# Patient Record
Sex: Female | Born: 1985 | Race: Black or African American | Hispanic: No | Marital: Single | Smoking: Current every day smoker
Health system: Southern US, Community
[De-identification: ages and names within clinical notes are randomized; demographics above are authoritative.]

## PROBLEM LIST (undated history)

## (undated) DIAGNOSIS — R51 Headache: Secondary | ICD-10-CM

## (undated) DIAGNOSIS — I10 Essential (primary) hypertension: Secondary | ICD-10-CM

## (undated) DIAGNOSIS — S62609A Fracture of unspecified phalanx of unspecified finger, initial encounter for closed fracture: Secondary | ICD-10-CM

## (undated) DIAGNOSIS — M419 Scoliosis, unspecified: Secondary | ICD-10-CM

## (undated) DIAGNOSIS — J302 Other seasonal allergic rhinitis: Secondary | ICD-10-CM

## (undated) HISTORY — DX: Scoliosis, unspecified: M41.9

---
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
## 2004-04-15 ENCOUNTER — Emergency Department (HOSPITAL_COMMUNITY): Admission: EM | Admit: 2004-04-15 | Discharge: 2004-04-15 | Payer: Self-pay | Admitting: Family Medicine

## 2004-04-26 ENCOUNTER — Inpatient Hospital Stay (HOSPITAL_COMMUNITY): Admission: AD | Admit: 2004-04-26 | Discharge: 2004-04-26 | Payer: Self-pay | Admitting: Obstetrics and Gynecology

## 2004-04-28 ENCOUNTER — Inpatient Hospital Stay (HOSPITAL_COMMUNITY): Admission: AD | Admit: 2004-04-28 | Discharge: 2004-04-28 | Payer: Self-pay | Admitting: Obstetrics and Gynecology

## 2004-05-13 ENCOUNTER — Ambulatory Visit: Payer: Self-pay | Admitting: Obstetrics and Gynecology

## 2004-11-10 ENCOUNTER — Inpatient Hospital Stay (HOSPITAL_COMMUNITY): Admission: AD | Admit: 2004-11-10 | Discharge: 2004-11-10 | Payer: Self-pay | Admitting: Obstetrics and Gynecology

## 2005-02-09 ENCOUNTER — Inpatient Hospital Stay (HOSPITAL_COMMUNITY): Admission: AD | Admit: 2005-02-09 | Discharge: 2005-02-09 | Payer: Self-pay | Admitting: Obstetrics & Gynecology

## 2005-03-30 ENCOUNTER — Ambulatory Visit (HOSPITAL_COMMUNITY): Admission: RE | Admit: 2005-03-30 | Discharge: 2005-03-30 | Payer: Self-pay | Admitting: *Deleted

## 2005-04-13 ENCOUNTER — Ambulatory Visit: Payer: Self-pay | Admitting: *Deleted

## 2005-05-04 ENCOUNTER — Ambulatory Visit: Payer: Self-pay | Admitting: Obstetrics & Gynecology

## 2005-05-11 ENCOUNTER — Ambulatory Visit: Payer: Self-pay | Admitting: *Deleted

## 2005-05-18 ENCOUNTER — Ambulatory Visit: Payer: Self-pay | Admitting: Obstetrics & Gynecology

## 2005-05-25 ENCOUNTER — Ambulatory Visit: Payer: Self-pay | Admitting: Obstetrics & Gynecology

## 2005-05-31 ENCOUNTER — Ambulatory Visit: Payer: Self-pay | Admitting: Family Medicine

## 2005-05-31 ENCOUNTER — Inpatient Hospital Stay (HOSPITAL_COMMUNITY): Admission: RE | Admit: 2005-05-31 | Discharge: 2005-06-02 | Payer: Self-pay | Admitting: *Deleted

## 2005-06-05 ENCOUNTER — Inpatient Hospital Stay (HOSPITAL_COMMUNITY): Admission: AD | Admit: 2005-06-05 | Discharge: 2005-06-05 | Payer: Self-pay | Admitting: *Deleted

## 2005-06-09 ENCOUNTER — Ambulatory Visit: Payer: Self-pay | Admitting: Family Medicine

## 2005-06-16 ENCOUNTER — Ambulatory Visit: Payer: Self-pay | Admitting: Family Medicine

## 2005-06-23 ENCOUNTER — Ambulatory Visit: Payer: Self-pay | Admitting: *Deleted

## 2005-06-30 ENCOUNTER — Ambulatory Visit: Payer: Self-pay | Admitting: Family Medicine

## 2005-07-07 ENCOUNTER — Ambulatory Visit: Payer: Self-pay | Admitting: Family Medicine

## 2005-07-14 ENCOUNTER — Ambulatory Visit: Payer: Self-pay | Admitting: Family Medicine

## 2005-07-15 ENCOUNTER — Inpatient Hospital Stay (HOSPITAL_COMMUNITY): Admission: AD | Admit: 2005-07-15 | Discharge: 2005-07-15 | Payer: Self-pay | Admitting: Obstetrics and Gynecology

## 2005-07-15 ENCOUNTER — Ambulatory Visit: Payer: Self-pay | Admitting: Family Medicine

## 2005-07-17 ENCOUNTER — Ambulatory Visit: Payer: Self-pay | Admitting: *Deleted

## 2005-07-17 ENCOUNTER — Inpatient Hospital Stay (HOSPITAL_COMMUNITY): Admission: AD | Admit: 2005-07-17 | Discharge: 2005-07-19 | Payer: Self-pay | Admitting: Family Medicine

## 2005-09-16 ENCOUNTER — Emergency Department (HOSPITAL_COMMUNITY): Admission: EM | Admit: 2005-09-16 | Discharge: 2005-09-16 | Payer: Self-pay | Admitting: Family Medicine

## 2005-09-21 ENCOUNTER — Emergency Department (HOSPITAL_COMMUNITY): Admission: EM | Admit: 2005-09-21 | Discharge: 2005-09-21 | Payer: Self-pay | Admitting: Emergency Medicine

## 2005-09-28 ENCOUNTER — Encounter: Admission: RE | Admit: 2005-09-28 | Discharge: 2005-09-28 | Payer: Self-pay | Admitting: Nephrology

## 2005-10-11 ENCOUNTER — Emergency Department (HOSPITAL_COMMUNITY): Admission: EM | Admit: 2005-10-11 | Discharge: 2005-10-11 | Payer: Self-pay | Admitting: Family Medicine

## 2005-10-17 ENCOUNTER — Emergency Department (HOSPITAL_COMMUNITY): Admission: EM | Admit: 2005-10-17 | Discharge: 2005-10-17 | Payer: Self-pay | Admitting: Emergency Medicine

## 2005-10-20 ENCOUNTER — Emergency Department (HOSPITAL_COMMUNITY): Admission: EM | Admit: 2005-10-20 | Discharge: 2005-10-20 | Payer: Self-pay | Admitting: Emergency Medicine

## 2005-11-14 ENCOUNTER — Inpatient Hospital Stay (HOSPITAL_COMMUNITY): Admission: AD | Admit: 2005-11-14 | Discharge: 2005-11-14 | Payer: Self-pay | Admitting: *Deleted

## 2006-06-07 ENCOUNTER — Inpatient Hospital Stay (HOSPITAL_COMMUNITY): Admission: AD | Admit: 2006-06-07 | Discharge: 2006-06-07 | Payer: Self-pay | Admitting: Gynecology

## 2006-06-09 ENCOUNTER — Ambulatory Visit: Payer: Self-pay | Admitting: Gynecology

## 2006-06-23 ENCOUNTER — Emergency Department (HOSPITAL_COMMUNITY): Admission: EM | Admit: 2006-06-23 | Discharge: 2006-06-23 | Payer: Self-pay | Admitting: Family Medicine

## 2006-10-05 ENCOUNTER — Encounter: Admission: RE | Admit: 2006-10-05 | Discharge: 2006-10-25 | Payer: Self-pay | Admitting: Sports Medicine

## 2006-12-08 ENCOUNTER — Emergency Department (HOSPITAL_COMMUNITY): Admission: EM | Admit: 2006-12-08 | Discharge: 2006-12-08 | Payer: Self-pay | Admitting: Family Medicine

## 2007-08-16 ENCOUNTER — Inpatient Hospital Stay (HOSPITAL_COMMUNITY): Admission: AD | Admit: 2007-08-16 | Discharge: 2007-08-16 | Payer: Self-pay | Admitting: Obstetrics & Gynecology

## 2007-09-27 ENCOUNTER — Ambulatory Visit (HOSPITAL_COMMUNITY): Admission: RE | Admit: 2007-09-27 | Discharge: 2007-09-27 | Payer: Self-pay | Admitting: Obstetrics & Gynecology

## 2007-10-04 ENCOUNTER — Ambulatory Visit: Payer: Self-pay | Admitting: Obstetrics & Gynecology

## 2007-10-11 ENCOUNTER — Ambulatory Visit (HOSPITAL_COMMUNITY): Admission: RE | Admit: 2007-10-11 | Discharge: 2007-10-11 | Payer: Self-pay | Admitting: Obstetrics & Gynecology

## 2007-11-01 ENCOUNTER — Ambulatory Visit: Payer: Self-pay | Admitting: Family Medicine

## 2007-11-08 ENCOUNTER — Ambulatory Visit: Payer: Self-pay | Admitting: Family Medicine

## 2007-11-15 ENCOUNTER — Ambulatory Visit: Payer: Self-pay | Admitting: Obstetrics & Gynecology

## 2007-11-22 ENCOUNTER — Ambulatory Visit: Payer: Self-pay | Admitting: Family Medicine

## 2007-11-22 ENCOUNTER — Ambulatory Visit (HOSPITAL_COMMUNITY): Admission: RE | Admit: 2007-11-22 | Discharge: 2007-11-22 | Payer: Self-pay | Admitting: Obstetrics & Gynecology

## 2007-11-29 ENCOUNTER — Ambulatory Visit: Payer: Self-pay | Admitting: Family Medicine

## 2007-12-06 ENCOUNTER — Ambulatory Visit: Payer: Self-pay | Admitting: Family Medicine

## 2007-12-13 ENCOUNTER — Ambulatory Visit: Payer: Self-pay | Admitting: Obstetrics & Gynecology

## 2007-12-20 ENCOUNTER — Ambulatory Visit: Payer: Self-pay | Admitting: Obstetrics & Gynecology

## 2007-12-20 ENCOUNTER — Ambulatory Visit (HOSPITAL_COMMUNITY): Admission: RE | Admit: 2007-12-20 | Discharge: 2007-12-20 | Payer: Self-pay | Admitting: Gynecology

## 2007-12-27 ENCOUNTER — Ambulatory Visit: Payer: Self-pay | Admitting: Obstetrics & Gynecology

## 2008-01-03 ENCOUNTER — Ambulatory Visit: Payer: Self-pay | Admitting: Family Medicine

## 2008-01-10 ENCOUNTER — Ambulatory Visit: Payer: Self-pay | Admitting: Family Medicine

## 2008-01-11 ENCOUNTER — Ambulatory Visit: Payer: Self-pay | Admitting: Obstetrics and Gynecology

## 2008-01-24 ENCOUNTER — Ambulatory Visit: Payer: Self-pay | Admitting: Obstetrics & Gynecology

## 2008-01-31 ENCOUNTER — Ambulatory Visit: Payer: Self-pay | Admitting: Family Medicine

## 2008-02-01 ENCOUNTER — Ambulatory Visit: Payer: Self-pay | Admitting: Family Medicine

## 2008-02-01 ENCOUNTER — Ambulatory Visit (HOSPITAL_COMMUNITY): Admission: RE | Admit: 2008-02-01 | Discharge: 2008-02-01 | Payer: Self-pay | Admitting: Family Medicine

## 2008-02-04 ENCOUNTER — Ambulatory Visit: Payer: Self-pay | Admitting: Obstetrics & Gynecology

## 2008-02-07 ENCOUNTER — Ambulatory Visit: Payer: Self-pay | Admitting: Family Medicine

## 2008-02-11 ENCOUNTER — Ambulatory Visit: Payer: Self-pay | Admitting: Family Medicine

## 2008-02-11 ENCOUNTER — Inpatient Hospital Stay (HOSPITAL_COMMUNITY): Admission: AD | Admit: 2008-02-11 | Discharge: 2008-02-14 | Payer: Self-pay | Admitting: Obstetrics & Gynecology

## 2008-02-12 HISTORY — PX: TUBAL LIGATION: SHX77

## 2008-05-20 IMAGING — US US OB FOLLOW-UP
1 series · 14 of 28 positions shown · non-contrast
Comparison: none

OBSTETRICAL ULTRASOUND:
 This ultrasound exam was performed in the [HOSPITAL] Ultrasound Department.  The OB US report was generated in the AS system, and faxed to the ordering physician.  This report is also available in [REDACTED] PACS.

[Series 1: us ob follow-up · 14 of 30 slices shown]
[im 2/30]
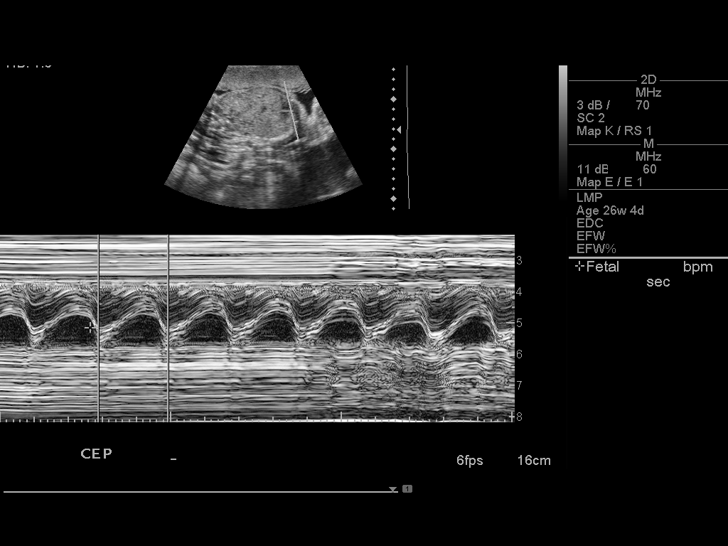
[im 4/30]
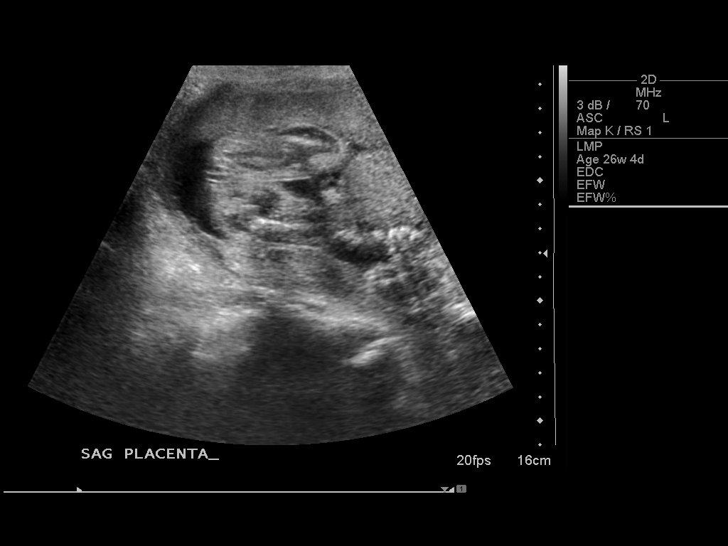
[im 6/30]
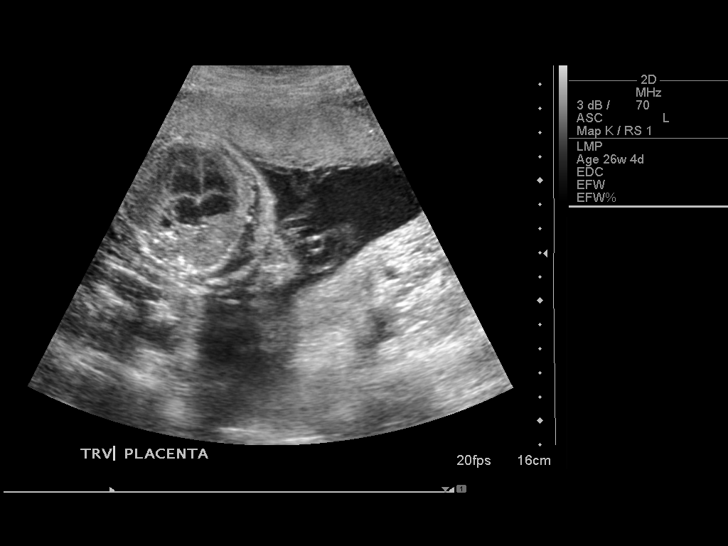
[im 8/30]
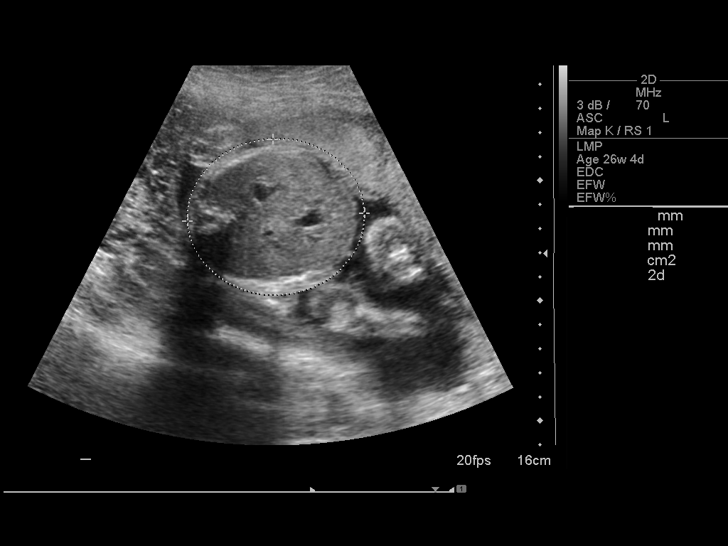
[im 10/30]
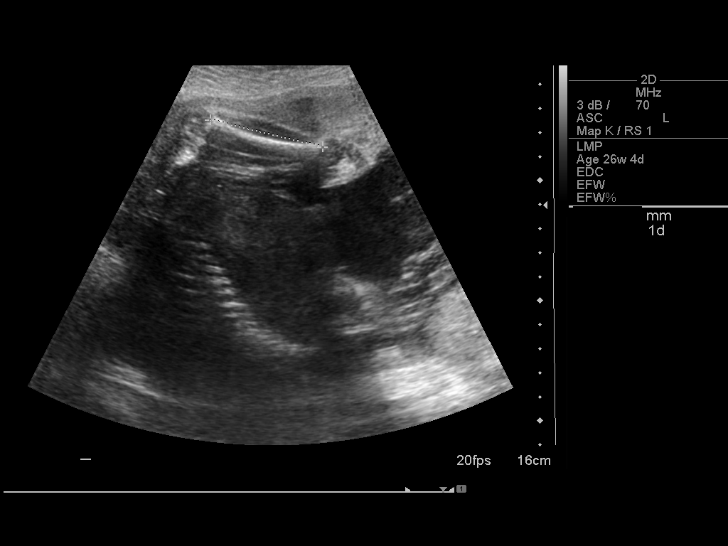
[im 12/30]
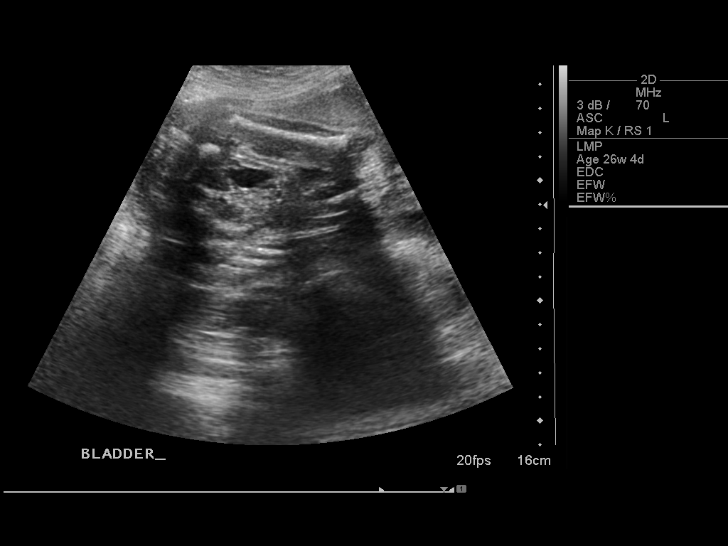
[im 14/30]
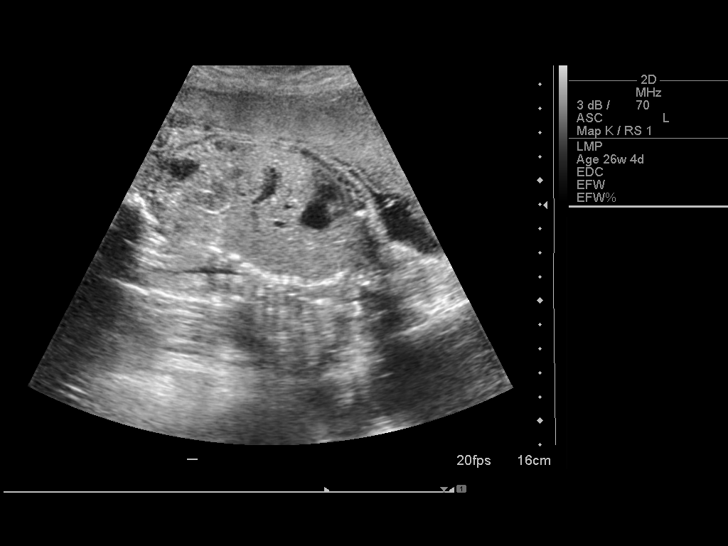
[im 17/30]
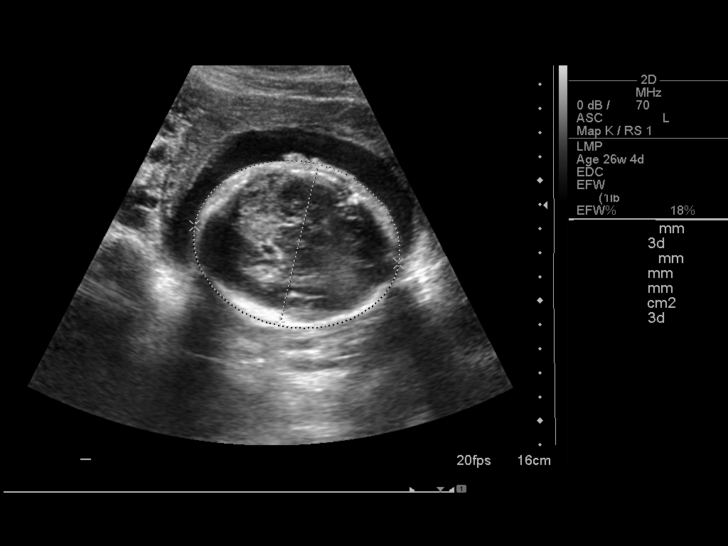
[im 19/30]
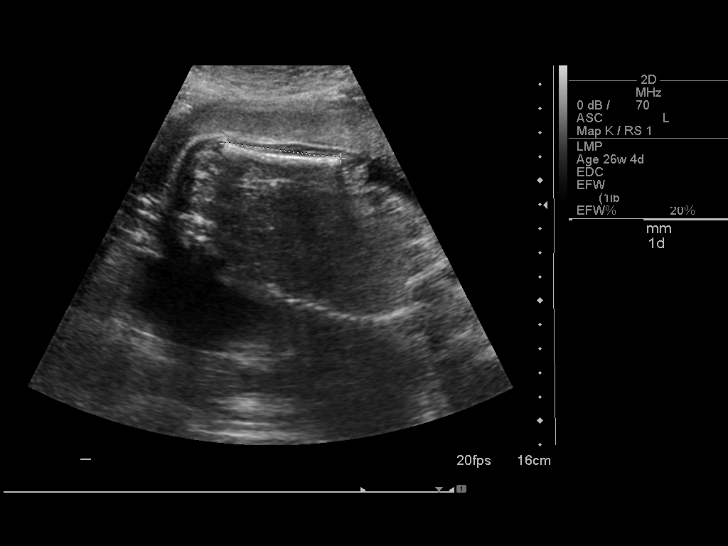
[im 21/30]
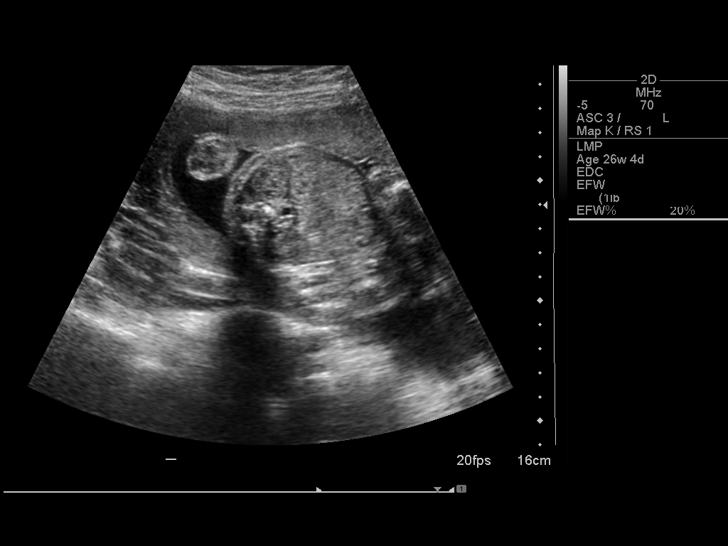
[im 23/30]
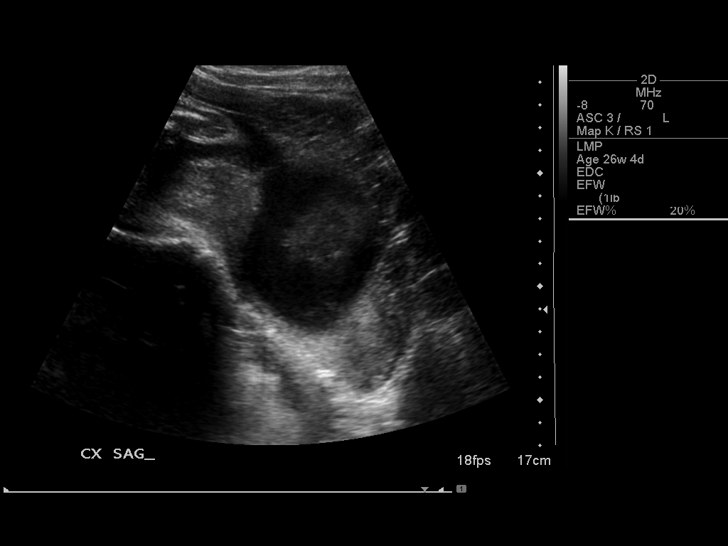
[im 25/30]
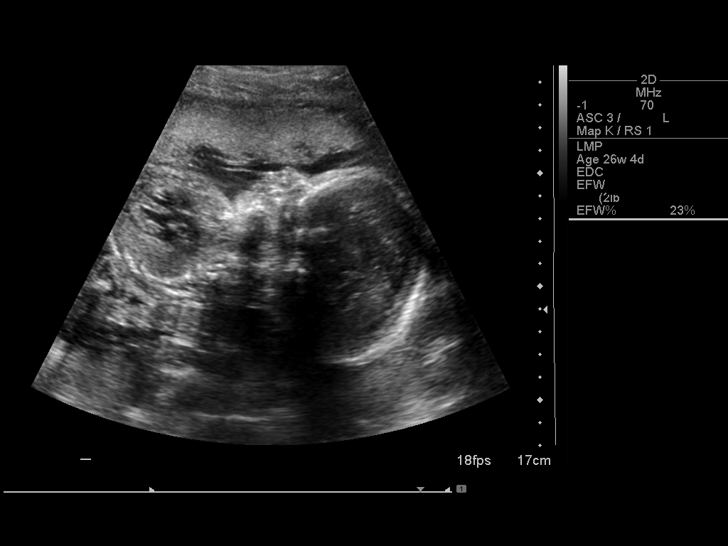
[im 27/30]
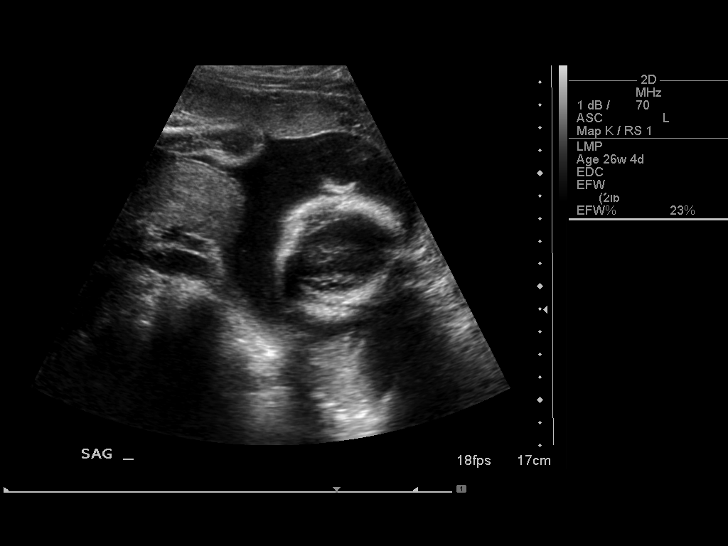
[im 30/30]
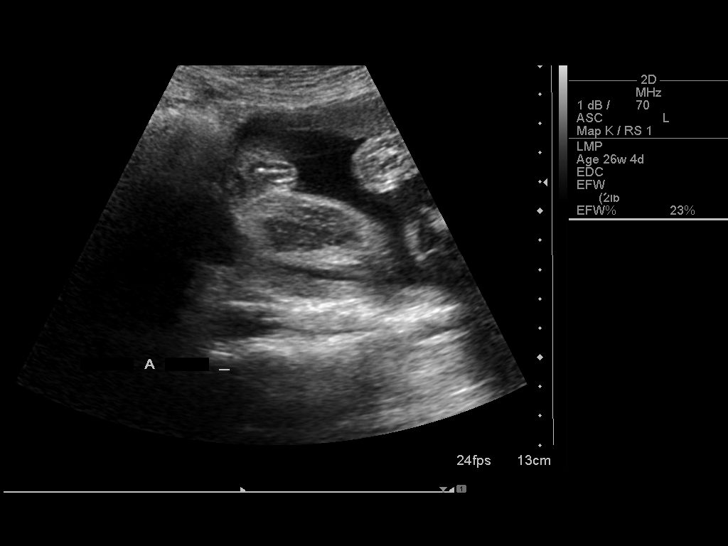

[14 of 28 positions shown; findings below may reference images not displayed]

IMPRESSION: See AS Obstetric US report.

## 2008-12-17 ENCOUNTER — Emergency Department (HOSPITAL_COMMUNITY): Admission: EM | Admit: 2008-12-17 | Discharge: 2008-12-18 | Payer: Self-pay | Admitting: Emergency Medicine

## 2009-03-09 ENCOUNTER — Emergency Department (HOSPITAL_COMMUNITY): Admission: EM | Admit: 2009-03-09 | Discharge: 2009-03-09 | Payer: Self-pay | Admitting: Family Medicine

## 2009-03-09 ENCOUNTER — Emergency Department (HOSPITAL_COMMUNITY): Admission: EM | Admit: 2009-03-09 | Discharge: 2009-03-09 | Payer: Self-pay | Admitting: Emergency Medicine

## 2009-04-18 ENCOUNTER — Emergency Department (HOSPITAL_COMMUNITY): Admission: EM | Admit: 2009-04-18 | Discharge: 2009-04-18 | Payer: Self-pay | Admitting: Family Medicine

## 2009-06-05 ENCOUNTER — Emergency Department (HOSPITAL_COMMUNITY): Admission: EM | Admit: 2009-06-05 | Discharge: 2009-06-05 | Payer: Self-pay | Admitting: Family Medicine

## 2009-06-25 ENCOUNTER — Emergency Department (HOSPITAL_COMMUNITY): Admission: EM | Admit: 2009-06-25 | Discharge: 2009-06-25 | Payer: Self-pay | Admitting: Emergency Medicine

## 2009-07-24 ENCOUNTER — Emergency Department (HOSPITAL_COMMUNITY): Admission: EM | Admit: 2009-07-24 | Discharge: 2009-07-24 | Payer: Self-pay | Admitting: Family Medicine

## 2009-08-01 ENCOUNTER — Inpatient Hospital Stay (HOSPITAL_COMMUNITY): Admission: AD | Admit: 2009-08-01 | Discharge: 2009-08-01 | Payer: Self-pay | Admitting: Obstetrics & Gynecology

## 2009-10-19 ENCOUNTER — Inpatient Hospital Stay (HOSPITAL_COMMUNITY): Admission: AD | Admit: 2009-10-19 | Discharge: 2009-10-19 | Payer: Self-pay | Admitting: Obstetrics & Gynecology

## 2009-11-22 ENCOUNTER — Emergency Department (HOSPITAL_COMMUNITY): Admission: EM | Admit: 2009-11-22 | Discharge: 2009-11-22 | Payer: Self-pay | Admitting: Emergency Medicine

## 2010-01-20 ENCOUNTER — Emergency Department (HOSPITAL_COMMUNITY): Admission: EM | Admit: 2010-01-20 | Discharge: 2010-01-20 | Payer: Self-pay | Admitting: Emergency Medicine

## 2010-03-26 ENCOUNTER — Emergency Department (HOSPITAL_COMMUNITY): Admission: EM | Admit: 2010-03-26 | Discharge: 2010-03-26 | Payer: Self-pay | Admitting: Emergency Medicine

## 2010-04-19 ENCOUNTER — Emergency Department (HOSPITAL_COMMUNITY): Admission: EM | Admit: 2010-04-19 | Discharge: 2010-04-19 | Payer: Self-pay | Admitting: Emergency Medicine

## 2010-06-09 ENCOUNTER — Encounter (INDEPENDENT_AMBULATORY_CARE_PROVIDER_SITE_OTHER): Payer: Self-pay | Admitting: *Deleted

## 2010-06-09 ENCOUNTER — Ambulatory Visit: Payer: Self-pay | Admitting: Internal Medicine

## 2010-06-09 LAB — CONVERTED CEMR LAB
BUN: 11 mg/dL (ref 6–23)
Basophils Relative: 1 % (ref 0–1)
Chloride: 102 meq/L (ref 96–112)
Creatinine, Ser: 0.7 mg/dL (ref 0.40–1.20)
Glucose, Bld: 79 mg/dL (ref 70–99)
Hemoglobin: 12.9 g/dL (ref 12.0–15.0)
Lymphs Abs: 2.8 10*3/uL (ref 0.7–4.0)
MCHC: 33.9 g/dL (ref 30.0–36.0)
MCV: 87.4 fL (ref 78.0–100.0)
Microalb, Ur: 0.52 mg/dL (ref 0.00–1.89)
Monocytes Absolute: 0.5 10*3/uL (ref 0.1–1.0)
Monocytes Relative: 9 % (ref 3–12)
Neutro Abs: 2 10*3/uL (ref 1.7–7.7)
RBC: 4.36 M/uL (ref 3.87–5.11)
WBC: 5.5 10*3/uL (ref 4.0–10.5)

## 2010-10-27 ENCOUNTER — Emergency Department (HOSPITAL_COMMUNITY)
Admission: EM | Admit: 2010-10-27 | Discharge: 2010-10-27 | Disposition: A | Discharge status: Home / Self Care | Payer: Self-pay | Attending: Emergency Medicine | Admitting: Emergency Medicine

## 2010-10-27 DIAGNOSIS — R5383 Other fatigue: Secondary | ICD-10-CM | POA: Insufficient documentation

## 2010-10-27 DIAGNOSIS — R110 Nausea: Secondary | ICD-10-CM | POA: Insufficient documentation

## 2010-10-27 DIAGNOSIS — Z79899 Other long term (current) drug therapy: Secondary | ICD-10-CM | POA: Insufficient documentation

## 2010-10-27 DIAGNOSIS — I10 Essential (primary) hypertension: Secondary | ICD-10-CM | POA: Insufficient documentation

## 2010-10-27 DIAGNOSIS — F3289 Other specified depressive episodes: Secondary | ICD-10-CM | POA: Insufficient documentation

## 2010-10-27 DIAGNOSIS — K2970 Gastritis, unspecified, without bleeding: Secondary | ICD-10-CM | POA: Insufficient documentation

## 2010-10-27 DIAGNOSIS — J45909 Unspecified asthma, uncomplicated: Secondary | ICD-10-CM | POA: Insufficient documentation

## 2010-10-27 DIAGNOSIS — R10819 Abdominal tenderness, unspecified site: Secondary | ICD-10-CM | POA: Insufficient documentation

## 2010-10-27 DIAGNOSIS — R5381 Other malaise: Secondary | ICD-10-CM | POA: Insufficient documentation

## 2010-10-27 DIAGNOSIS — R109 Unspecified abdominal pain: Secondary | ICD-10-CM | POA: Insufficient documentation

## 2010-10-27 DIAGNOSIS — F329 Major depressive disorder, single episode, unspecified: Secondary | ICD-10-CM | POA: Insufficient documentation

## 2010-10-27 LAB — URINALYSIS, ROUTINE W REFLEX MICROSCOPIC
Hgb urine dipstick: NEGATIVE
Ketones, ur: NEGATIVE mg/dL
Protein, ur: NEGATIVE mg/dL
Urine Glucose, Fasting: NEGATIVE mg/dL
Urobilinogen, UA: 0.2 mg/dL (ref 0.0–1.0)

## 2010-10-27 LAB — BASIC METABOLIC PANEL
BUN: 8 mg/dL (ref 6–23)
Calcium: 8.2 mg/dL — ABNORMAL LOW (ref 8.4–10.5)
Creatinine, Ser: 0.71 mg/dL (ref 0.4–1.2)
GFR calc non Af Amer: >60 mL/min (ref >60)
Glucose, Bld: 101 mg/dL — ABNORMAL HIGH (ref 70–99)

## 2010-10-27 LAB — CBC
HCT: 32.4 % — ABNORMAL LOW (ref 36.0–46.0)
MCH: 30 pg (ref 26.0–34.0)
MCHC: 34.9 g/dL (ref 30.0–36.0)
MCV: 85.9 fL (ref 78.0–100.0)
Platelets: 189 10*3/uL (ref 150–400)
RDW: 13.3 % (ref 11.5–15.5)
WBC: 5.5 10*3/uL (ref 4.0–10.5)

## 2010-10-27 LAB — DIFFERENTIAL
Eosinophils Absolute: 0.2 10*3/uL (ref 0.0–0.7)
Eosinophils Relative: 4 % (ref 0–5)
Lymphs Abs: 3.7 10*3/uL (ref 0.7–4.0)
Monocytes Absolute: 0.4 10*3/uL (ref 0.1–1.0)
Monocytes Relative: 6 % (ref 3–12)

## 2010-10-27 LAB — LIPASE, BLOOD: Lipase: 37 U/L (ref 11–59)

## 2010-10-27 LAB — HEPATIC FUNCTION PANEL
Bilirubin, Direct: <0.1 mg/dL (ref 0.0–0.3)
Total Protein: 6.1 g/dL (ref 6.0–8.3)

## 2010-11-15 LAB — POCT I-STAT, CHEM 8
BUN: 13 mg/dL (ref 6–23)
Calcium, Ion: 1.13 mmol/L (ref 1.12–1.32)
Creatinine, Ser: 0.7 mg/dL (ref 0.4–1.2)
Hemoglobin: 14.3 g/dL (ref 12.0–15.0)
Sodium: 138 mEq/L (ref 135–145)
TCO2: 26 mmol/L (ref 0–100)

## 2010-11-17 LAB — WET PREP, GENITAL
Trich, Wet Prep: NONE SEEN
Yeast Wet Prep HPF POC: NONE SEEN

## 2010-11-17 LAB — DIFFERENTIAL
Eosinophils Absolute: 0.3 10*3/uL (ref 0.0–0.7)
Lymphocytes Relative: 52 % — ABNORMAL HIGH (ref 12–46)
Lymphs Abs: 3.2 10*3/uL (ref 0.7–4.0)
Monocytes Relative: 8 % (ref 3–12)
Neutro Abs: 2.1 10*3/uL (ref 1.7–7.7)
Neutrophils Relative %: 34 % — ABNORMAL LOW (ref 43–77)

## 2010-11-17 LAB — URINALYSIS, ROUTINE W REFLEX MICROSCOPIC
Glucose, UA: NEGATIVE mg/dL
Hgb urine dipstick: NEGATIVE
Ketones, ur: NEGATIVE mg/dL
Protein, ur: NEGATIVE mg/dL
Urobilinogen, UA: 1 mg/dL (ref 0.0–1.0)

## 2010-11-17 LAB — CBC
Platelets: 185 10*3/uL (ref 150–400)
RBC: 3.81 MIL/uL — ABNORMAL LOW (ref 3.87–5.11)
WBC: 6.1 10*3/uL (ref 4.0–10.5)

## 2010-11-30 LAB — WET PREP, GENITAL: Yeast Wet Prep HPF POC: NONE SEEN

## 2010-11-30 LAB — URINALYSIS, ROUTINE W REFLEX MICROSCOPIC
Glucose, UA: NEGATIVE mg/dL
Protein, ur: NEGATIVE mg/dL
Specific Gravity, Urine: 1.02 (ref 1.005–1.030)
pH: 7 (ref 5.0–8.0)

## 2010-11-30 LAB — GC/CHLAMYDIA PROBE AMP, GENITAL: GC Probe Amp, Genital: NEGATIVE

## 2010-11-30 LAB — POCT PREGNANCY, URINE: Preg Test, Ur: NEGATIVE

## 2010-12-02 LAB — D-DIMER, QUANTITATIVE: D-Dimer, Quant: <0.22 ug/mL-FEU (ref 0.00–0.48)

## 2011-01-11 NOTE — Op Note (Signed)
Cassandra Nicholson, LOS           ACCOUNT NO.:  [id number]   MEDICAL RECORD NO.:  [id number]          PATIENT TYPE:  INP   LOCATION:                                [facility]ILITY:  [facility]   PHYSICIAN:  Tanya S. Shawnie Pons, M.D.   DATE OF BIRTH:  March 24, 1986   DATE OF PROCEDURE:  02/12/2008  DATE OF DISCHARGE:  02/14/2008                               OPERATIVE REPORT   PREOPERATIVE DIAGNOSES:  1. Postpartum day #0 from spontaneous vaginal delivery.  2. Desires sterilization.   POSTOPERATIVE DIAGNOSES:  1. Postpartum day #0 from spontaneous vaginal delivery.  2. Desires sterilization.   PROCEDURE:  Bilateral tubal occlusion with Filshie clips.   SURGEON:  Shelbie Proctor. Shawnie Pons, MD   ASSISTANT:  Karlton Lemon, MD   ANESTHESIA:  Epidural.   FINDINGS:  Normal tubes, uterus at level of umbilicus.   SPECIMENS:  None.   ESTIMATED BLOOD LOSS:  Less than 10 mL.   COMPLICATIONS:  None immediate.   INDICATIONS FOR PROCEDURE:  Cassandra Nicholson is a 25 year old gravida 3,  para 2-1-0-3 that is postpartum day #0 from spontaneous vaginal delivery  of viable infant female.  She desires permanent sterilization with  bilateral tubal occlusion.  The patient has been counseled about the  permanent nature of the procedure and the risk of possible failure.  She  has also been counseled about the risks of bleeding, infection, and  injury to surrounding tissues.  She thus been counseled about the risk  of ectopic, if she does get pregnant after this procedure.  The patient  has signed her Medicaid consent 30 days in advance and understands the  permanent nature and risks of the procedure.   DESCRIPTION OF PROCEDURE:  The patient was taken to the operating room  where epidural was re-dosed that she had placed during labor.  She was  placed in the supine position and prepped and draped in the usual  sterile manner.  After ensuring adequate anesthesia, a transverse skin  incision was made 1 cm below the umbilicus.   Incision was carried down  to the subcutaneous tissues with the scalpel.  The fascia was identified  and entered bluntly with hemostats.  It was further opened with Mayo  scissors.  The peritoneum was identified and entered bluntly with  hemostat.  Army[employer] retractors were placed, and the patient was placed  in Trendelenburg with a rightward tilt.  A Ray-Tec was tagged with a  hemostat was then placed within the abdomen to displace omentum and  provide good visualization.  The left tube was visualized and clamped  with Babcock clamp.  A second Tanja Port was used to follow the tube out to  the fimbria.  The fimbria was easily identified and two Babcock clamps  were then placed at the isthmus ampullary junction.  Under direct  visualization, a Filshie clip was placed at the isthmus ampullary  junction with good occlusion of the tube.  After placement of the  Filshie clip, the Babcock clamps released and the tube was allowed to  fall back within the abdominal cavity.  The patient was then turned to  the left and attention was turned to identify the right tube.  The right  tube was identified and clamped with Babcock clamp.  In the same manner,  the second Babcock clamp was used to follow the tube out to the fimbria  which was easily identified.  The Babcock clamps were then placed at the  isthmus ampullary junction, and a Filshie clip was placed between the  two Babcock clamps under direct visualization with good occlusion of the  tube.  After placement of the clip, the Babcock clamps were released.  The tube was allowed to fall back within the peritoneal cavity.  The Ray-  Tec sponge was removed from the abdomen and the abdomen was closed  beginning with the fascial layer with 0 Vicryl in a running unlocked  fashion.  The subcutaneous tissue was hemostatic.  The skin was closed  with a Vicryl skin subcuticular stitch in the usual fashion.  The  patient tolerated the procedure well and the  sponge and needle counts  were correct x3.  The patient was in Post Anesthesia Care Unit in stable  condition.      Karlton Lemon, MD  Electronically Signed     ______________________________  Shelbie Proctor. Shawnie Pons, M.D.    NS/MEDQ  D:  02/12/2008  T:  02/13/2008  Job:  [id number]

## 2011-01-14 NOTE — Group Therapy Note (Signed)
NAME:  Cassandra Nicholson, Cassandra Nicholson                     ACCOUNT NO.:  [id number]   MEDICAL RECORD NO.:  [id number]                   PATIENT TYPE:  WOC   LOCATION:  [facility]                           FACILITY:  [facility]   PHYSICIAN:  Argentina Donovan, MD                     DATE OF BIRTH:  September 09, 1985   DATE OF SERVICE:  05/13/2004                                    CLINIC NOTE   CHIEF COMPLAINT:  Follow-up of PID.   HISTORY OF PRESENT ILLNESS:  This is a 25 year old single G1 P1-0-0-1 who  was seen at the [facility] for a 3-day history of left lower quadrant pain and  yellowish vaginal discharge and pruritus.  No history of fever, nausea,  vomiting, nor urinary symptoms, hematochezia, or melena.  Lab data at the  time showed wet prep with moderate Trichomonas.  GC and chlamydia cultures  were both positive.  An abdominal ultrasound showed normal findings.  Pelvic  ultrasound showed resolving hemorrhagic ovarian cyst with no free fluid.  The patient was diagnosed with PID and given Rocephin 250 mg IM and 1 g of  Zithromax p.o.  She was sent home with a prescription for doxycycline 100 mg  p.o. b.i.d. for 10 days and metronidazole 500 mg p.o. b.i.d. for 14 days  with good compliance.  The patient reports improvement of her symptoms with  resolution of the abdominal pain and vaginal discharge.   PAST MEDICAL HISTORY:  Unremarkable.   SURGICAL HISTORY:  Unremarkable.   MEDICATIONS:  None.   ALLERGIES:  No known drug allergies.   MENSTRUAL HISTORY:  Menarche at 25 years old with irregular cycles lasting 4-  5 days in duration, moderate flow.  Last normal menstrual period was in  August.  Pap smear done in March 2005 was normal.   OBSTETRICAL HISTORY:  G1 P1-0-0-1.  The patient had a spontaneous vaginal  delivery with no complications.   PERSONAL AND SOCIAL HISTORY:  The patient smokes one-half pack per day for 3  years.  Denies alcohol use and is presently not involved in a sexual  relationship.  Had  history of marijuana use for a year.   OBJECTIVE:  VITAL SIGNS:  Blood pressure 124/80, pulse rate of 101.  GENERAL:  Pleasant African-American female, not in distress.  ABDOMEN:  Normal bowel sounds, flat, soft, no masses, no tenderness.  PELVIC:  Normal external genitalia.  Smooth vagina.  No vaginal discharge.  Cervix firm, smooth, nontender on wriggling.  Corpus small and anteverted.  No adnexal masses or tenderness.   ASSESSMENT:  Pelvic inflammatory disease, resolved.   PLAN:  The patient educated on PID and measures to prevent another episode  of PID.  Advised to follow up at [facility] for annual Pap smear and  safe sex using condoms.  Argentina Donovan, MD    PR/MEDQ  D:  05/13/2004  T:  05/13/2004  Job:  [id number]

## 2011-01-14 NOTE — Discharge Summary (Signed)
Cassandra Nicholson, Cassandra Nicholson                     ACCOUNT NO.:  [id number]   MEDICAL RECORD NO.:  [id number]                   PATIENT TYPE:  INP   LOCATION:  9103                                 [facility]ILITY:  [facility]   PHYSICIAN:  Lorne Skeens, D.O.                   DATE OF BIRTH:  11-13-1985   DATE OF ADMISSION:  07/23/2002  DATE OF DISCHARGE:  07/26/2002                                 DISCHARGE SUMMARY   DISCHARGE DIAGNOSES:  1. Dynamic shortening of the cervix by 1.9 cm.  2. Bacterial vaginosis.  3. Group B strep positive.  4. Uterine irritability.   DISCHARGE MEDICATIONS:  1. Flagyl 500 mg one by mouth twice a day times ten days total.  2. Amoxicillin 500 mg one by mouth every eight hours times two weeks with     meals.  3. Prenatal vitamins one by mouth each day.   DISCHARGE INSTRUCTIONS:  Preterm labor precautions and close follow-up with  the high risk clinic.   FOLLOW UP:  August 06, 2002 at 3:15 p.m.   HISTORY OF PRESENT ILLNESS:  Cassandra Nicholson is a 25 year old prima Gravida at 23  and 6/7th weeks by initial ultrasound on 11 weeks that presented four  admission due to a short cervical length seen on ultrasound the day prior to  admission. A transvaginal ultrasound on July 22, 2002 revealed a  cervical length of 1.9 cm and a dynamic cervical change. At this point, she  was also group B strep positive and wet prep was positive for bacterial  vaginosis. On July 22, 2002 her examination reveals a cervix of 1 cm at  50% effaced and high. The patient was admitted to [facility] for IV  Unasyn for 72 hours, put on modified bedrest, and started on by mouth  Metronidazole. Her other risk factors for preterm delivery include a history  of Chlamydia in March of 2003 pre-pregnancy and also treated again in  September of 2003 in the first trimester. She is also a smoker and had a PAP  smear positive for ascus.   PHYSICAL EXAMINATION:  VITAL SIGNS: On admission, vitals were  stable. She  was afebrile. Heart regular rate and rhythm. No murmur, rub, or gallop.  GENERAL: Alert and oriented times three.  LUNGS: Clear to auscultation and percussion bilaterally.  ABDOMEN: Soft, positive bowel sounds. Fundal height at the umbilicus.  Speculum examination was done in the clinic on July 22, 2002 and not  repeated. Digital cervical examination as stated above. Fetal heart rate  baseline was 145.   HOSPITAL COURSE:  During course of stay, the patient was placed on IV fluid  rehydration, modified bedrest and received intermittent monitoring for  Doppler fetal heart tones. The patient was not complaining of contractions,  but all during her stay denied any abdominal pain or cramping. Denies any  vaginal discharge or bleeding. On discharge date, the patient has received  close  to 72 hours of IV Unasyn for her group B strep positive labs. She will  also need intrapartum treatment. The patient will be continued on her  Metronidazole for a total of two weeks at discharge. She will also be placed  on modified bedrest and states that she has help at home. Follow-up  ultrasound is recommended within two weeks to recheck cervical length.   DISPOSITION:  The patient is discharged to home on preterm labor precautions  and she understands the importance of taking her antibiotics at home.                                               Lorne Skeens, D.O.    KL/MEDQ  D:  07/26/2002  T:  07/27/2002  Job:  [id number]   cc:   Conni Elliot, M.D.  [address]  [place]  [place] [address]  Fax: [phone]

## 2011-01-14 NOTE — Discharge Summary (Signed)
Cassandra Nicholson, Cassandra Nicholson           ACCOUNT NO.:  [id number]   MEDICAL RECORD NO.:  [id number]          PATIENT TYPE:  INP   LOCATION:                                [facility]   PHYSICIAN:  Lesly Dukes, M.D. DATE OF BIRTH:  10-31-1985   DATE OF ADMISSION:  DATE OF DISCHARGE:  06/02/2005                                 DISCHARGE SUMMARY   ADMITTING DIAGNOSES:  1.  Preterm cervical change.  2.  Dynamic cervix on ultrasound.  3.  GBS positivity.   DISCHARGE DIAGNOSES:  1.  Preterm cervical change.  2.  Dynamic cervix on ultrasound.  3.  GBS positivity.   DISCHARGE ATTENDING:  Dr. Penne Lash   ADMITTING HISTORY AND PHYSICAL:  Patient is a 25 year old G2, P0-1-0-1 who  presented at 32-4/7 weeks.  She had a routine ultrasound today and was found  to have a dynamic cervix on ultrasound so she was brought in for IV  antibiotics.  On examination patient had an external os of 1 cm that was  able to push my finger towards the internal os, but did not force my finger  in and she was 50% effaced and high.  Patient had GC, Chlamydia, wet prep  cultures done.  She was found to have GBS positivity last week in clinic and  was given a prescription for amoxicillin which the patient did not take.   HOSPITAL COURSE:  Patient was treated with Unasyn.  Patient remained stable,  complained of no contraction.  Felt the baby move and the baby had a  reassuring NST throughout her admission.  Patient at the time of discharge  had external os dilated 1 cm, closed internal os, very high, 50% effaced.   DISCHARGE INSTRUCTIONS:  Patient was discharged to home.  She was to follow  up in the clinic on June 09, 2005 at 9 a.m.  She was to continue the  amoxicillin prescription that she already had to take 500 mg one p.o. t.i.d.  x5 days which will be approximately seven days' medication.  Patient was to  be on bed rest with nothing vaginally while at home.     ______________________________  Karoline Caldwell  B. Merlene Morse, MD    ______________________________  Lesly Dukes, M.D.    ABC/MEDQ  D:  06/02/2005  T:  06/02/2005  Job:  [id number]

## 2011-02-08 ENCOUNTER — Inpatient Hospital Stay (HOSPITAL_COMMUNITY)
Admission: AD | Admit: 2011-02-08 | Discharge: 2011-02-08 | Disposition: A | Discharge status: Home / Self Care | Payer: Self-pay | Source: Ambulatory Visit | Attending: Obstetrics & Gynecology | Admitting: Obstetrics & Gynecology

## 2011-02-08 DIAGNOSIS — R109 Unspecified abdominal pain: Secondary | ICD-10-CM | POA: Insufficient documentation

## 2011-02-08 DIAGNOSIS — K5900 Constipation, unspecified: Secondary | ICD-10-CM | POA: Insufficient documentation

## 2011-02-08 LAB — URINALYSIS, ROUTINE W REFLEX MICROSCOPIC
Hgb urine dipstick: NEGATIVE
Nitrite: NEGATIVE
Specific Gravity, Urine: >1.03 — ABNORMAL HIGH (ref 1.005–1.030)
Urobilinogen, UA: 0.2 mg/dL (ref 0.0–1.0)
pH: 6 (ref 5.0–8.0)

## 2011-02-08 LAB — WET PREP, GENITAL
Trich, Wet Prep: NONE SEEN
Yeast Wet Prep HPF POC: NONE SEEN

## 2011-02-08 LAB — POCT PREGNANCY, URINE: Preg Test, Ur: NEGATIVE

## 2011-05-20 LAB — POCT URINALYSIS DIP (DEVICE)
Bilirubin Urine: NEGATIVE
Glucose, UA: NEGATIVE
Ketones, ur: NEGATIVE
Operator id: 159681
Protein, ur: NEGATIVE
Specific Gravity, Urine: 1.01

## 2011-05-23 LAB — POCT URINALYSIS DIP (DEVICE)
Bilirubin Urine: NEGATIVE
Bilirubin Urine: NEGATIVE
Glucose, UA: NEGATIVE
Ketones, ur: NEGATIVE
Ketones, ur: NEGATIVE
Leukocytes, UA: NEGATIVE
Operator id: 148111
Operator id: 148111
Specific Gravity, Urine: 1.015

## 2011-05-24 LAB — POCT URINALYSIS DIP (DEVICE)
Bilirubin Urine: NEGATIVE
Glucose, UA: NEGATIVE
Glucose, UA: NEGATIVE
Glucose, UA: NEGATIVE
Hgb urine dipstick: NEGATIVE
Ketones, ur: NEGATIVE
Ketones, ur: NEGATIVE
Ketones, ur: NEGATIVE
Nitrite: NEGATIVE
Operator id: 148111
Operator id: 135281
Operator id: 148111
Protein, ur: NEGATIVE
Specific Gravity, Urine: 1.015
Specific Gravity, Urine: 1.02
Specific Gravity, Urine: 1.02
Urobilinogen, UA: 0.2
Urobilinogen, UA: 1
pH: 7

## 2011-05-25 LAB — POCT URINALYSIS DIP (DEVICE)
Bilirubin Urine: NEGATIVE
Bilirubin Urine: NEGATIVE
Hgb urine dipstick: NEGATIVE
Hgb urine dipstick: NEGATIVE
Ketones, ur: NEGATIVE
Ketones, ur: NEGATIVE
Specific Gravity, Urine: 1.015
pH: 7
pH: 7

## 2011-05-26 LAB — POCT URINALYSIS DIP (DEVICE)
Bilirubin Urine: NEGATIVE
Hgb urine dipstick: NEGATIVE
Ketones, ur: NEGATIVE
Ketones, ur: NEGATIVE
pH: 7
pH: 7

## 2011-05-26 LAB — COMPREHENSIVE METABOLIC PANEL
Alkaline Phosphatase: 108
BUN: 7
CO2: 22
Chloride: 105
GFR calc non Af Amer: >60
Glucose, Bld: 92
Potassium: 3.8
Total Bilirubin: 0.7
Total Protein: 6.3

## 2011-05-26 LAB — RPR: RPR Ser Ql: NONREACTIVE

## 2011-05-26 LAB — CBC
HCT: 31.9 — ABNORMAL LOW
Hemoglobin: 10.9 — ABNORMAL LOW
RDW: 12.9

## 2011-05-26 LAB — URIC ACID: Uric Acid, Serum: 4.9

## 2011-06-03 LAB — POCT PREGNANCY, URINE
Operator id: 220991
Preg Test, Ur: POSITIVE

## 2011-06-03 LAB — URINALYSIS, ROUTINE W REFLEX MICROSCOPIC
Nitrite: NEGATIVE
Protein, ur: NEGATIVE
Specific Gravity, Urine: 1.01
Urobilinogen, UA: 0.2

## 2012-01-09 ENCOUNTER — Emergency Department (HOSPITAL_COMMUNITY)
Admission: EM | Admit: 2012-01-09 | Discharge: 2012-01-09 | Disposition: A | Discharge status: Home / Self Care | Payer: Self-pay | Source: Home / Self Care | Attending: Emergency Medicine | Admitting: Emergency Medicine

## 2012-01-09 ENCOUNTER — Telehealth (HOSPITAL_COMMUNITY): Payer: Self-pay | Admitting: *Deleted

## 2012-01-09 ENCOUNTER — Encounter (HOSPITAL_COMMUNITY): Payer: Self-pay | Admitting: Emergency Medicine

## 2012-01-09 DIAGNOSIS — J069 Acute upper respiratory infection, unspecified: Secondary | ICD-10-CM

## 2012-01-09 DIAGNOSIS — J45909 Unspecified asthma, uncomplicated: Secondary | ICD-10-CM

## 2012-01-09 DIAGNOSIS — B079 Viral wart, unspecified: Secondary | ICD-10-CM

## 2012-01-09 HISTORY — DX: Essential (primary) hypertension: I10

## 2012-01-09 MED ORDER — BECLOMETHASONE DIPROPIONATE 80 MCG/ACT IN AERS: 2.0000 | INHALATION_SPRAY | Freq: Two times a day (BID) | RESPIRATORY_TRACT | Status: DC

## 2012-01-09 MED ORDER — ALBUTEROL SULFATE (5 MG/ML) 0.5% IN NEBU
5.0000 mg | INHALATION_SOLUTION | Freq: Once | RESPIRATORY_TRACT | Status: AC
  Administered 2012-01-09: 5 mg via RESPIRATORY_TRACT

## 2012-01-09 MED ORDER — SALICYLIC ACID 17 % EX SOLN: Freq: Every day | CUTANEOUS | Status: DC

## 2012-01-09 MED ORDER — METHYLPREDNISOLONE SODIUM SUCC 125 MG IJ SOLR
125.0000 mg | Freq: Once | INTRAMUSCULAR | Status: AC
  Administered 2012-01-09: 125 mg via INTRAMUSCULAR

## 2012-01-09 MED ORDER — IPRATROPIUM BROMIDE 0.02 % IN SOLN
0.5000 mg | Freq: Once | RESPIRATORY_TRACT | Status: AC
  Administered 2012-01-09: 0.5 mg via RESPIRATORY_TRACT

## 2012-01-09 MED ORDER — ALBUTEROL SULFATE HFA 108 (90 BASE) MCG/ACT IN AERS: 1.0000 | INHALATION_SPRAY | Freq: Four times a day (QID) | RESPIRATORY_TRACT | Status: DC | PRN

## 2012-01-09 MED ORDER — PREDNISONE 10 MG PO TABS: ORAL_TABLET | ORAL | Status: DC

## 2012-01-09 MED ORDER — METHYLPREDNISOLONE SODIUM SUCC 125 MG IJ SOLR
INTRAMUSCULAR | Status: AC
  Filled 2012-01-09: qty 2

## 2012-01-09 MED ORDER — ALBUTEROL SULFATE (5 MG/ML) 0.5% IN NEBU
INHALATION_SOLUTION | RESPIRATORY_TRACT | Status: AC
  Filled 2012-01-09: qty 1

## 2012-01-09 NOTE — ED Notes (Signed)
Pt. called and said she has the orange card, but she would have to pay $100.00 and $60.00 for her 2 inhalers and $5.00 for her Prednisone at [facility]. She said she can't afford that. I explained that the orange card is not an insurance card.  I suggested she call her doctor at [facility] to see if they will prescribe it so she can get it filled at that pharmacy. They won't fill our Rx. 's any longer. I also told her to try the [facility] if they have it, it may be cheaper, also check with [facility] and [facility]. You don't have to be a member to use the [employer]. Vassie Moselle 01/09/2012

## 2012-01-09 NOTE — ED Provider Notes (Signed)
Chief Complaint  Patient presents with  . Asthma  . URI  . Finger Injury    History of Present Illness:   Cassandra Nicholson is a 26 year old female who has had a two-week history of URI symptoms with nasal congestion with clear rhinorrhea, sneezing, headache, sinus pressure, sore throat, and hot and cold spells. She also has slight abdominal pain, but denies any nausea, vomiting, or diarrhea. Over the past 4 days she's developed a cough with clear sputum, wheezing, shortness of breath, chest tightness, and pressure. She has a history of asthma as a child and this feels exactly like it did when she had asthma. She states has been years since her last asthma attack. She's never been hospitalized or on a ventilator. She was at the emergency room once for asthma. She tried her daughter's inhaler but it didn't help much.  She injured her left ring finger about 2 months ago. She is nice is in a door at a store. It swelled up but then improved but now she has a small bump on the end of the index finger which she thinks is infection. She was told by a pharmacist that would need to be opened up and drained.  Review of Systems:  Other than noted above, the patient denies any of the following symptoms. Systemic:  No fever, chills, sweats, fatigue, myalgias, headache, or anorexia. Eye:  No redness, pain or drainage. ENT:  No earache, ear congestion, nasal congestion, sneezing, rhinorrhea, sinus pressure, sinus pain, post nasal drip, or sore throat. Lungs:  No cough, sputum production, wheezing, shortness of breath, or chest pain. GI:  No abdominal pain, nausea, vomiting, or diarrhea. Skin:  No rash or itching.  PMFSH:  Past medical history, family history, social history, meds, and allergies were reviewed.  Physical Exam:   Vital signs:  BP 155/89  Pulse 89  Temp(Src) 98.9 F (37.2 C) (Oral)  Resp 20  SpO2 96%  LMP 12/09/2011 General:  Alert, in no distress. Eye:  No conjunctival injection or drainage. Lids  were normal. ENT:  TMs and canals were normal, without erythema or inflammation.  Nasal mucosa was clear and uncongested, without drainage.  Mucous membranes were moist.  Pharynx was clear, without exudate or drainage.  There were no oral ulcerations or lesions. Neck:  Supple, no adenopathy, tenderness or mass. Lungs:  No respiratory distress.  She has bilateral expiratory wheezes, no rales or rhonchi and she has no obvious respiratory distress, use of accessory muscles, or intercostal retractions.. Lungs were resonant to percussion.  No egophony. Heart:  Regular rhythm, without gallops, murmers or rubs. Skin:  She has a keratotic lesion on the distal phalanx of the left ring finger consistent with a wart.  Course in Urgent Care Center:   She was given a nebulizer treatment with DuoNeb and Solu-Medrol 125 mg IM. She tolerated these both without any immediate side effects and felt considerably better afterwards. Posttreatment her lungs were completely clear and wheeze free with good air movement bilaterally.  Assessment:  The primary encounter diagnosis was Viral upper respiratory infection. Diagnoses of Asthma and Wart were also pertinent to this visit.  Plan:   1.  The following meds were prescribed:   New Prescriptions   ALBUTEROL (PROVENTIL HFA;VENTOLIN HFA) 108 (90 BASE) MCG/ACT INHALER    Inhale 1-2 puffs into the lungs every 6 (six) hours as needed for wheezing.   BECLOMETHASONE (QVAR) 80 MCG/ACT INHALER    Inhale 2 puffs into the lungs 2 (two) times  daily.   PREDNISONE (DELTASONE) 10 MG TABLET    Take 4 tabs daily for 4 days, 3 tabs daily for 4 days, 2 tabs daily for 4 days, then 1 tab daily for 4 days.   SALICYLIC ACID-LACTIC ACID 17 % EXTERNAL SOLUTION    Apply topically daily.   2.  The patient was instructed in symptomatic care and handouts were given. 3.  The patient was told to return if becoming worse in any way, if no better in 3 or 4 days, and given some red flag symptoms that  would indicate earlier return. She was told to followup with her primary care physician within 2 weeks. If the wart was in better in a month, suggested that she call her primary care physician for referral to a dermatologist for freezing.   Reuben Likes, MD 01/09/12 [phone]
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
## 2012-01-09 NOTE — Discharge Instructions (Signed)
Asthma Attack Prevention HOW CAN ASTHMA BE PREVENTED? Currently, there is no way to prevent asthma from starting. However, you can take steps to control the disease and prevent its symptoms after you have been diagnosed. Learn about your asthma and how to control it. Take an active role to control your asthma by working with your caregiver to create and follow an asthma action plan. An asthma action plan guides you in taking your medicines properly, avoiding factors that make your asthma worse, tracking your level of asthma control, responding to worsening asthma, and seeking emergency care when needed. To track your asthma, keep records of your symptoms, check your peak flow number using a peak flow meter (handheld device that shows how well air moves out of your lungs), and get regular asthma checkups.  Other ways to prevent asthma attacks include:  Use medicines as your caregiver directs.   Identify and avoid things that make your asthma worse (as much as you can).   Keep track of your asthma symptoms and level of control.   Get regular checkups for your asthma.   With your caregiver, write a detailed plan for taking medicines and managing an asthma attack. Then be sure to follow your action plan. Asthma is an ongoing condition that needs regular monitoring and treatment.   Identify and avoid asthma triggers. A number of outdoor allergens and irritants (pollen, mold, cold air, air pollution) can trigger asthma attacks. Find out what causes or makes your asthma worse, and take steps to avoid those triggers (see below).   Monitor your breathing. Learn to recognize warning signs of an attack, such as slight coughing, wheezing or shortness of breath. However, your lung function may already decrease before you notice any signs or symptoms, so regularly measure and record your peak airflow with a home peak flow meter.   Identify and treat attacks early. If you act quickly, you're less likely to have  a severe attack. You will also need less medicine to control your symptoms. When your peak flow measurements decrease and alert you to an upcoming attack, take your medicine as instructed, and immediately stop any activity that may have triggered the attack. If your symptoms do not improve, get medical help.   Pay attention to increasing quick-relief inhaler use. If you find yourself relying on your quick-relief inhaler (such as albuterol), your asthma is not under control. See your caregiver about adjusting your treatment.  IDENTIFY AND CONTROL FACTORS THAT MAKE YOUR ASTHMA WORSE A number of common things can set off or make your asthma symptoms worse (asthma triggers). Keep track of your asthma symptoms for several weeks, detailing all the environmental and emotional factors that are linked with your asthma. When you have an asthma attack, go back to your asthma diary to see which factor, or combination of factors, might have contributed to it. Once you know what these factors are, you can take steps to control many of them.  Allergies: If you have allergies and asthma, it is important to take asthma prevention steps at home. Asthma attacks (worsening of asthma symptoms) can be triggered by allergies, which can cause temporary increased inflammation of your airways. Minimizing contact with the substance to which you are allergic will help prevent an asthma attack. Animal Dander:   Some people are allergic to the flakes of skin or dried saliva from animals with fur or feathers. Keep these pets out of your home.   If you can't keep a pet outdoors, keep the   pet out of your bedroom and other sleeping areas at all times, and keep the door closed.   Remove carpets and furniture covered with cloth from your home. If that is not possible, keep the pet away from fabric-covered furniture and carpets.  Dust Mites:  Many people with asthma are allergic to dust mites. Dust mites are tiny bugs that are found in  every home, in mattresses, pillows, carpets, fabric-covered furniture, bedcovers, clothes, stuffed toys, fabric, and other fabric-covered items.   Cover your mattress in a special dust-proof cover.   Cover your pillow in a special dust-proof cover, or wash the pillow each week in hot water. Water must be hotter than 130 F to kill dust mites. Cold or warm water used with detergent and bleach can also be effective.   Wash the sheets and blankets on your bed each week in hot water.   Try not to sleep or lie on cloth-covered cushions.   Call ahead when traveling and ask for a smoke-free hotel room. Bring your own bedding and pillows, in case the hotel only supplies feather pillows and down comforters, which may contain dust mites and cause asthma symptoms.   Remove carpets from your bedroom and those laid on concrete, if you can.   Keep stuffed toys out of the bed, or wash the toys weekly in hot water or cooler water with detergent and bleach.  Cockroaches:  Many people with asthma are allergic to the droppings and remains of cockroaches.   Keep food and garbage in closed containers. Never leave food out.   Use poison baits, traps, powders, gels, or paste (for example, boric acid).   If a spray is used to kill cockroaches, stay out of the room until the odor goes away.  Indoor Mold:  Fix leaky faucets, pipes, or other sources of water that have mold around them.   Clean moldy surfaces with a cleaner that has bleach in it.  Pollen and Outdoor Mold:  When pollen or mold spore counts are high, try to keep your windows closed.   Stay indoors with windows closed from late morning to afternoon, if you can. Pollen and some mold spore counts are highest at that time.   Ask your caregiver whether you need to take or increase anti-inflammatory medicine before your allergy season starts.  Irritants:   Tobacco smoke is an irritant. If you smoke, ask your caregiver how you can quit. Ask family  members to quit smoking, too. Do not allow smoking in your home or car.   If possible, do not use a wood-burning stove, kerosene heater, or fireplace. Minimize exposure to all sources of smoke, including incense, candles, fires, and fireworks.   Try to stay away from strong odors and sprays, such as perfume, talcum powder, hair spray, and paints.   Decrease humidity in your home and use an indoor air cleaning device. Reduce indoor humidity to below 60 percent. Dehumidifiers or central air conditioners can do this.   Try to have someone else vacuum for you once or twice a week, if you can. Stay out of rooms while they are being vacuumed and for a short while afterward.   If you vacuum, use a dust mask from a hardware store, a double-layered or microfilter vacuum cleaner bag, or a vacuum cleaner with a HEPA filter.   Sulfites in foods and beverages can be irritants. Do not drink beer or wine, or eat dried fruit, processed potatoes, or shrimp if they cause asthma   symptoms.   Cold air can trigger an asthma attack. Cover your nose and mouth with a scarf on cold or windy days.   Several health conditions can make asthma more difficult to manage, including runny nose, sinus infections, reflux disease, psychological stress, and sleep apnea. Your caregiver will treat these conditions, as well.   Avoid close contact with people who have a cold or the flu, since your asthma symptoms may get worse if you catch the infection from them. Wash your hands thoroughly after touching items that may have been handled by people with a respiratory infection.   Get a flu shot every year to protect against the flu virus, which often makes asthma worse for days or weeks. Also get a pneumonia shot once every five to 10 years.  Drugs:  Aspirin and other painkillers can cause asthma attacks. 10% to 20% of people with asthma have sensitivity to aspirin or a group of painkillers called non-steroidal anti-inflammatory drugs  (NSAIDS), such as ibuprofen and naproxen. These drugs are used to treat pain and reduce fevers. Asthma attacks caused by any of these medicines can be severe and even fatal. These drugs must be avoided in people who have known aspirin sensitive asthma. Products with acetaminophen are considered safe for people who have asthma. It is important that people with aspirin sensitivity read labels of all over-the-counter drugs used to treat pain, colds, coughs, and fever.   Beta blockers and ACE inhibitors are other drugs which you should discuss with your caregiver, in relation to your asthma.  ALLERGY SKIN TESTING  Ask your asthma caregiver about allergy skin testing or blood testing (RAST test) to identify the allergens to which you are sensitive. If you are found to have allergies, allergy shots (immunotherapy) for asthma may help prevent future allergies and asthma. With allergy shots, small doses of allergens (substances to which you are allergic) are injected under your skin on a regular schedule. Over a period of time, your body may become used to the allergen and less responsive with asthma symptoms. You can also take measures to minimize your exposure to those allergens. EXERCISE  If you have exercise-induced asthma, or are planning vigorous exercise, or exercise in cold, humid, or dry environments, prevent exercise-induced asthma by following your caregiver's advice regarding asthma treatment before exercising. Document Released: 08/03/2009 Document Revised: 08/04/2011 Document Reviewed: 08/03/2009 [employer] Patient Information 2012 [employer]. 

## 2012-05-25 ENCOUNTER — Encounter (HOSPITAL_COMMUNITY): Payer: Self-pay | Admitting: *Deleted

## 2012-05-25 ENCOUNTER — Emergency Department (HOSPITAL_COMMUNITY)
Admission: EM | Admit: 2012-05-25 | Discharge: 2012-05-25 | Disposition: A | Discharge status: Home / Self Care | Payer: Self-pay | Attending: Emergency Medicine | Admitting: Emergency Medicine

## 2012-05-25 DIAGNOSIS — H109 Unspecified conjunctivitis: Secondary | ICD-10-CM | POA: Insufficient documentation

## 2012-05-25 DIAGNOSIS — J45909 Unspecified asthma, uncomplicated: Secondary | ICD-10-CM | POA: Insufficient documentation

## 2012-05-25 DIAGNOSIS — F17200 Nicotine dependence, unspecified, uncomplicated: Secondary | ICD-10-CM | POA: Insufficient documentation

## 2012-05-25 DIAGNOSIS — I10 Essential (primary) hypertension: Secondary | ICD-10-CM | POA: Insufficient documentation

## 2012-05-25 MED ORDER — ERYTHROMYCIN 5 MG/GM OP OINT
TOPICAL_OINTMENT | Freq: Once | OPHTHALMIC | Status: AC
  Administered 2012-05-25: 04:00:00 via OPHTHALMIC
  Filled 2012-05-25: qty 1

## 2012-05-25 NOTE — ED Provider Notes (Signed)
History     CSN: [id number]  Arrival date & time 05/25/12  0234   First MD Initiated Contact with Patient 05/25/12 [phone]      Chief Complaint  Patient presents with  . Eye Drainage    (Consider location/radiation/quality/duration/timing/severity/associated sxs/prior treatment) HPI 26 yo female presents to the ER with complaint of itching and drainage from left eye.  She has had itching for the last few days, has been using friend's allergy drops.  This am noticed thick yellow green drainage from left eye, crusting.  No fevers.  Some swelling of left eyelid.  No URI sxs, no ear pain, no rash.  Past Medical History  Diagnosis Date  . Asthma   . Hypertension     Past Surgical History  Procedure Date  . Tubal ligation     No family history on file.  History  Substance Use Topics  . Smoking status: Current Every Day Smoker -- 0.2 packs/day    Types: Cigarettes  . Smokeless tobacco: Not on file  . Alcohol Use: No     occasional    OB History    Grav Para Term Preterm Abortions TAB SAB Ect Mult Living                  Review of Systems  All other systems reviewed and are negative.    Allergies  Review of patient's allergies indicates no known allergies.  Home Medications  No current outpatient prescriptions on file.  BP 109/88  Pulse 91  Temp 98.1 F (36.7 C) (Oral)  Resp 18  SpO2 97%  Physical Exam  Nursing note and vitals reviewed. Constitutional: She is oriented to person, place, and time. She appears well-developed and well-nourished.  HENT:  Head: Normocephalic and atraumatic.  Right Ear: External ear normal.  Left Ear: External ear normal.  Nose: Nose normal.  Mouth/Throat: Oropharynx is clear and moist.  Eyes: EOM are normal. Pupils are equal, round, and reactive to light. Left eye exhibits discharge.       conjuctiva erythematous, irritated  Neck: Normal range of motion. Neck supple. No JVD present. No tracheal deviation present. No  thyromegaly present.  Cardiovascular: Normal rate, regular rhythm, normal heart sounds and intact distal pulses.  Exam reveals no gallop and no friction rub.   No murmur heard. Pulmonary/Chest: Effort normal and breath sounds normal. No stridor. No respiratory distress. She has no wheezes. She has no rales. She exhibits no tenderness.  Abdominal: Soft. Bowel sounds are normal. She exhibits no distension and no mass. There is no tenderness. There is no rebound and no guarding.  Musculoskeletal: Normal range of motion. She exhibits no edema and no tenderness.  Lymphadenopathy:    She has no cervical adenopathy.  Neurological: She is alert and oriented to person, place, and time. She exhibits normal muscle tone. Coordination normal.  Skin: Skin is warm and dry. No rash noted. No erythema. No pallor.  Psychiatric: She has a normal mood and affect. Her behavior is normal. Judgment and thought content normal.    ED Course  Procedures (including critical care time)  Labs Reviewed - No data to display No results found.   1. Conjunctivitis       MDM  26 yo F with conjunctivitis.  WIll treat with erythromycin ointment, f/u with ophtho prn.        Olivia Mackie, MD 05/25/12 2200

## 2012-05-25 NOTE — ED Notes (Signed)
Pt to ED c/o soreness, swelling and thick yellow drainage to L eye since this am.

## 2012-05-25 NOTE — ED Notes (Signed)
Pt states she woke up this morning with her left eye swollen shut.  C/o yellow drainage and irritation.  States her neighbors daughter had eye infection recently and this could be related.  Also c/o nasal congestion.

## 2012-10-24 ENCOUNTER — Inpatient Hospital Stay (HOSPITAL_COMMUNITY)
Admission: AD | Admit: 2012-10-24 | Discharge: 2012-10-24 | Disposition: A | Discharge status: Home / Self Care | Payer: Self-pay | Source: Ambulatory Visit | Attending: Obstetrics & Gynecology | Admitting: Obstetrics & Gynecology

## 2012-10-24 ENCOUNTER — Encounter (HOSPITAL_COMMUNITY): Payer: Self-pay | Admitting: *Deleted

## 2012-10-24 ENCOUNTER — Inpatient Hospital Stay (HOSPITAL_COMMUNITY): Payer: Self-pay

## 2012-10-24 DIAGNOSIS — R1011 Right upper quadrant pain: Secondary | ICD-10-CM | POA: Insufficient documentation

## 2012-10-24 DIAGNOSIS — R1013 Epigastric pain: Secondary | ICD-10-CM | POA: Insufficient documentation

## 2012-10-24 DIAGNOSIS — B9689 Other specified bacterial agents as the cause of diseases classified elsewhere: Secondary | ICD-10-CM | POA: Insufficient documentation

## 2012-10-24 DIAGNOSIS — N760 Acute vaginitis: Secondary | ICD-10-CM | POA: Insufficient documentation

## 2012-10-24 DIAGNOSIS — R109 Unspecified abdominal pain: Secondary | ICD-10-CM

## 2012-10-24 DIAGNOSIS — N949 Unspecified condition associated with female genital organs and menstrual cycle: Secondary | ICD-10-CM | POA: Insufficient documentation

## 2012-10-24 DIAGNOSIS — A499 Bacterial infection, unspecified: Secondary | ICD-10-CM | POA: Insufficient documentation

## 2012-10-24 LAB — URINALYSIS, ROUTINE W REFLEX MICROSCOPIC
Ketones, ur: NEGATIVE mg/dL
Leukocytes, UA: NEGATIVE
Protein, ur: 30 mg/dL — AB
Urobilinogen, UA: 0.2 mg/dL (ref 0.0–1.0)

## 2012-10-24 LAB — AMYLASE: Amylase: 49 U/L (ref 0–105)

## 2012-10-24 LAB — CBC
Hemoglobin: 13.3 g/dL (ref 12.0–15.0)
MCH: 29.6 pg (ref 26.0–34.0)
Platelets: 198 10*3/uL (ref 150–400)
RBC: 4.5 MIL/uL (ref 3.87–5.11)
WBC: 6.6 10*3/uL (ref 4.0–10.5)

## 2012-10-24 LAB — POCT PREGNANCY, URINE: Preg Test, Ur: NEGATIVE

## 2012-10-24 LAB — COMPREHENSIVE METABOLIC PANEL
AST: 21 U/L (ref 0–37)
Albumin: 4.2 g/dL (ref 3.5–5.2)
BUN: 14 mg/dL (ref 6–23)
CO2: 24 mEq/L (ref 19–32)
Calcium: 9.5 mg/dL (ref 8.4–10.5)
Creatinine, Ser: 0.75 mg/dL (ref 0.50–1.10)
GFR calc non Af Amer: >90 mL/min (ref >90)
Total Bilirubin: 0.4 mg/dL (ref 0.3–1.2)

## 2012-10-24 LAB — WET PREP, GENITAL: Yeast Wet Prep HPF POC: NONE SEEN

## 2012-10-24 LAB — LIPASE, BLOOD: Lipase: 32 U/L (ref 11–59)

## 2012-10-24 LAB — HCG, SERUM, QUALITATIVE: Preg, Serum: NEGATIVE

## 2012-10-24 MED ORDER — METRONIDAZOLE 500 MG PO TABS: 500.0000 mg | ORAL_TABLET | Freq: Two times a day (BID) | ORAL | Status: DC

## 2012-10-24 NOTE — MAU Provider Note (Signed)
History     CSN: [id number]  Arrival date and time: 10/24/12 4098   First Provider Initiated Contact with Patient 10/24/12 [phone]     Chief Complaint  Patient presents with  . Abdominal Pain  . Possible Pregnancy   Abdominal Pain Associated symptoms include headaches. Pertinent negatives include no constipation, diarrhea, dysuria, fever, frequency, hematuria, nausea, vomiting or weight loss.   Cassandra Nicholson is a 27 y.o. female presenting today with abdominal pain this morning. She states the pain started a couple months ago and was located in her lower abdomen. She states the pain has gradually worsened throughout the last few months, and reports radiation to the epigastric and RUQ. She describes the pain as coming and going and she rates it a 10/10 and sharp when it is worst, but 4/10 today with some cramping. She endorses nausea, but only in the mornings. No vomiting or changes in stool. Last bowel movement was this morning. She is able to eat without problems, pain is not exacerbated with food. She denies dysphagia. She states her and her husband drink three 12-packs of Heineken per week, but she states she has 4-5 cans of Heinekens per week. Husband believes she drinks more than that. She is also concerned she is pregnant even though she had a tubal ligation 4 years ago after her last child. POCT Pregnancy urine is negative today.   She also states she has been having some white, frothy vaginal discharge for the last few days. She denies burning, itching, vaginal bleeding.  She denies dysuria, hematuria, urinary frequency/urgency, chest pain, shortness of breath.   Past Medical History  Diagnosis Date  . Asthma   . Hypertension    Past Surgical History  Procedure Laterality Date  . Tubal ligation     History reviewed. No pertinent family history.  History  Substance Use Topics  . Smoking status: Current Every Day Smoker -- 0.25 packs/day    Types: Cigarettes  . Smokeless  tobacco: Not on file  . Alcohol Use: 2 - 2.5 oz/week    4-5 drink(s) per week     Comment: she states 4-5, husband believes way more than that.   Allergies: No Known Allergies  Prescriptions prior to admission  Medication Sig Dispense Refill  . Pseudoeph-Doxylamine-DM-APAP (NYQUIL PO) Take 1 tablet by mouth as needed (for cold).      . Pseudoephedrine-APAP-DM (DAYQUIL PO) Take 1 tablet by mouth as needed (for cold).       Review of Systems  Constitutional: Negative for fever, chills and weight loss.  HENT:       Headaches about every other day, lasting 30 minutes. No correlation with abd pain.  Respiratory: Negative for cough and shortness of breath.   Cardiovascular: Negative for chest pain and palpitations.  Gastrointestinal: Positive for abdominal pain. Negative for heartburn, nausea, vomiting, diarrhea and constipation.  Genitourinary: Negative for dysuria, urgency, frequency, hematuria and flank pain.  Musculoskeletal: Negative.   Neurological: Positive for headaches.   Physical Exam   Blood pressure 147/95, temperature 97.8 F (36.6 C), temperature source Oral, resp. rate 16, height 5\' 10"  (1.778 m), weight 78.472 kg (173 lb).  Physical Exam  Constitutional: She is oriented to person, place, and time. She appears well-developed and well-nourished.  HENT:  Head: Normocephalic and atraumatic.  Eyes: Pupils are equal, round, and reactive to light. Scleral icterus is present.  Cardiovascular: Normal rate and regular rhythm.   Respiratory: Effort normal and breath sounds normal.  GI: Soft.  Normal appearance and bowel sounds are normal. She exhibits no distension and no mass. There is tenderness in the right upper quadrant. There is no rebound, no guarding, no tenderness at McBurney's point and negative Murphy's sign.  Genitourinary: Vaginal discharge found.  White, frothy vaginal discharge. No bleeding.  Musculoskeletal: Normal range of motion.  Neurological: She is alert and  oriented to person, place, and time.  Skin: Skin is warm and dry.  Psychiatric: She has a normal mood and affect. Her behavior is normal. Thought content normal.   MAU Course  Procedures  MDM Results for orders placed during the hospital encounter of 10/24/12 (from the past 24 hour(s))  URINALYSIS, ROUTINE W REFLEX MICROSCOPIC     Status: Abnormal   Collection Time    10/24/12  9:05 AM      Result Value Range   Color, Urine YELLOW  YELLOW   APPearance CLEAR  CLEAR   Specific Gravity, Urine 1.020  1.005 - 1.030   pH 6.5  5.0 - 8.0   Glucose, UA NEGATIVE  NEGATIVE mg/dL   Hgb urine dipstick NEGATIVE  NEGATIVE   Bilirubin Urine NEGATIVE  NEGATIVE   Ketones, ur NEGATIVE  NEGATIVE mg/dL   Protein, ur 30 (*) NEGATIVE mg/dL   Urobilinogen, UA 0.2  0.0 - 1.0 mg/dL   Nitrite NEGATIVE  NEGATIVE   Leukocytes, UA NEGATIVE  NEGATIVE  URINE MICROSCOPIC-ADD ON     Status: None   Collection Time    10/24/12  9:05 AM      Result Value Range   Squamous Epithelial / LPF RARE  RARE   WBC, UA 0-2  <3 WBC/hpf   RBC / HPF 0-2  <3 RBC/hpf  POCT PREGNANCY, URINE     Status: None   Collection Time    10/24/12  9:18 AM      Result Value Range   Preg Test, Ur NEGATIVE  NEGATIVE  WET PREP, GENITAL     Status: Abnormal   Collection Time    10/24/12  9:49 AM      Result Value Range   Yeast Wet Prep HPF POC NONE SEEN  NONE SEEN   Trich, Wet Prep NONE SEEN  NONE SEEN   Clue Cells Wet Prep HPF POC FEW (*) NONE SEEN   WBC, Wet Prep HPF POC MANY (*) NONE SEEN  COMPREHENSIVE METABOLIC PANEL     Status: Abnormal   Collection Time    10/24/12 10:16 AM      Result Value Range   Sodium 136  135 - 145 mEq/L   Potassium 4.5  3.5 - 5.1 mEq/L   Chloride 101  96 - 112 mEq/L   CO2 24  19 - 32 mEq/L   Glucose, Bld 102 (*) 70 - 99 mg/dL   BUN 14  6 - 23 mg/dL   Creatinine, Ser 1.61  0.50 - 1.10 mg/dL   Calcium 9.5  8.4 - 09.6 mg/dL   Total Protein 7.5  6.0 - 8.3 g/dL   Albumin 4.2  3.5 - 5.2 g/dL   AST 21   0 - 37 U/L   ALT 21  0 - 35 U/L   Alkaline Phosphatase 45  39 - 117 U/L   Total Bilirubin 0.4  0.3 - 1.2 mg/dL   GFR calc non Af Amer >90  >90 mL/min   GFR calc Af Amer >90  >90 mL/min  CBC     Status: None   Collection Time  10/24/12 10:16 AM      Result Value Range   WBC 6.6  4.0 - 10.5 K/uL   RBC 4.50  3.87 - 5.11 MIL/uL   Hemoglobin 13.3  12.0 - 15.0 g/dL   HCT 10.9  60.4 - 54.0 %   MCV 87.6  78.0 - 100.0 fL   MCH 29.6  26.0 - 34.0 pg   MCHC 33.8  30.0 - 36.0 g/dL   RDW 98.1  19.1 - 47.8 %   Platelets 198  150 - 400 K/uL  HCG, SERUM, QUALITATIVE     Status: None   Collection Time    10/24/12 10:16 AM      Result Value Range   Preg, Serum NEGATIVE  NEGATIVE   Ultrasound Results:   *RADIOLOGY REPORT*  Clinical Data: Right upper quadrant pain.  COMPLETE ABDOMINAL ULTRASOUND  Comparison: None.  Findings:  Gallbladder: No gallstones, gallbladder wall thickening, or  pericholecystic fluid.  Common bile duct: Measures 0.3 cm.  Liver: No focal lesion identified. Within normal limits in  parenchymal echogenicity.  IVC: Appears normal.  Pancreas: No focal abnormality seen.  Spleen: No splenomegaly or focal lesion.  Right Kidney: Measures 10.6 cm and appears normal.  Left Kidney: Measures 11.8 cm and appears normal.  Abdominal aorta: No aneurysm identified.  IMPRESSION:  Negative abdominal ultrasound.  Original Report Authenticated By: Holley Dexter, M.D.  Assessment and Plan  27 y.o. female presenting today with abdominal pain and some vaginal discharge  1.) Bacterial Vaginosis:  Wet prep showed clue cells were present. She is symptomatic today with mild cramping.   Flagyl 500 mg.   2.) CBC, CMet, Amylase, Lipase, UA, Ultrasound were all negative.  3.) Advised patient to follow up with a primary care provider. [facility].   [place], PA-S  10/24/2012, 11:26 AM   Discussed hygiene products and  probiotics for avoiding recurrence of BV.  Discussed importance of limiting alcohol consumption. Patient also advised strongly to have no alcohol consumption while taking Flagyl.  Patient is known HTN and will not get Rx filled from previous PCP secondary to financial reasons. Patient advised to start care with PCP or her choice soon.   I have seen and evaluated this patient with the PA student. I agree with the assessment and plan as written above.   Freddi Starr, PA-C 10/24/2012 1:43 PM

## 2012-10-24 NOTE — MAU Note (Signed)
Patient thinks she might be pregnant. States she has been having mid to upper abdominal pain for "some months" but now having lower abdominal pain. Slight vaginal discharge.

## 2012-10-25 LAB — GC/CHLAMYDIA PROBE AMP: CT Probe RNA: NEGATIVE

## 2012-10-25 NOTE — MAU Provider Note (Signed)
Attestation of Attending Supervision of Advanced Practitioner (PA/CNM/NP): Evaluation and management procedures were performed by the Advanced Practitioner under my supervision and collaboration.  I have reviewed the Advanced Practitioner's note and chart, and I agree with the management and plan.  Cherl Gorney, MD, FACOG Attending Obstetrician & Gynecologist Faculty Practice, [facility]  

## 2012-12-12 ENCOUNTER — Emergency Department (HOSPITAL_COMMUNITY)
Admission: EM | Admit: 2012-12-12 | Discharge: 2012-12-13 | Disposition: A | Discharge status: Home / Self Care | Payer: Medicaid Other | Attending: Emergency Medicine | Admitting: Emergency Medicine

## 2012-12-12 ENCOUNTER — Emergency Department (HOSPITAL_COMMUNITY): Payer: Medicaid Other

## 2012-12-12 ENCOUNTER — Encounter (HOSPITAL_COMMUNITY): Payer: Self-pay | Admitting: *Deleted

## 2012-12-12 DIAGNOSIS — Y9389 Activity, other specified: Secondary | ICD-10-CM | POA: Insufficient documentation

## 2012-12-12 DIAGNOSIS — S62609A Fracture of unspecified phalanx of unspecified finger, initial encounter for closed fracture: Secondary | ICD-10-CM | POA: Insufficient documentation

## 2012-12-12 DIAGNOSIS — J45909 Unspecified asthma, uncomplicated: Secondary | ICD-10-CM | POA: Insufficient documentation

## 2012-12-12 DIAGNOSIS — F17200 Nicotine dependence, unspecified, uncomplicated: Secondary | ICD-10-CM | POA: Insufficient documentation

## 2012-12-12 DIAGNOSIS — W108XXA Fall (on) (from) other stairs and steps, initial encounter: Secondary | ICD-10-CM | POA: Insufficient documentation

## 2012-12-12 DIAGNOSIS — I10 Essential (primary) hypertension: Secondary | ICD-10-CM | POA: Insufficient documentation

## 2012-12-12 DIAGNOSIS — Y929 Unspecified place or not applicable: Secondary | ICD-10-CM | POA: Insufficient documentation

## 2012-12-12 HISTORY — DX: Fracture of unspecified phalanx of unspecified finger, initial encounter for closed fracture: S62.609A

## 2012-12-12 NOTE — ED Provider Notes (Signed)
History    This chart was scribed for non-physician practitioner working with Hanley Seamen, MD by Smitty Pluck, ED scribe. This patient was seen in room WTR7/WTR7 and the patient's care was started at 11:23 PM.   CSN: [id number]  Arrival date & time 12/12/12  2310     Chief Complaint  Patient presents with  . Finger Injury    left 5th     The history is provided by the patient. No language interpreter was used.    Cassandra Nicholson is a 27 y.o. female who presents to the Emergency Department complaining of constant, moderate left fifth finger pain due to injury onset by a fall that occurred earlier today.  Pt states that she fell upstairs while running, and caught her fall with her pinky. Pt denies LOC, head injury, fever, chills, nausea, vomiting, diarrhea, weakness, cough, SOB and any other pain. No other injuries. Pain worsened by pain and palpation of the finger. Has not tried any medications for it. States attached pop sickle stick to keep it straight.    Past Medical History  Diagnosis Date  . Asthma   . Hypertension     Past Surgical History  Procedure Laterality Date  . Tubal ligation      No family history on file.  History  Substance Use Topics  . Smoking status: Current Every Day Smoker -- 0.25 packs/day    Types: Cigarettes  . Smokeless tobacco: Not on file  . Alcohol Use: 2 - 2.5 oz/week    4-5 drink(s) per week     Comment: she states 4-5, husband believes way more than that.    OB History   Grav Para Term Preterm Abortions TAB SAB Ect Mult Living   4 3 2 1 1 1  0 0 0 3      Review of Systems  Constitutional: Negative for fever and chills.  Respiratory: Negative for shortness of breath.   Gastrointestinal: Negative for nausea and vomiting.  Musculoskeletal: Positive for arthralgias.  Neurological: Negative for weakness.    Allergies  Review of patient's allergies indicates no known allergies.  Home Medications   Current Outpatient Rx   Name  Route  Sig  Dispense  Refill  . metroNIDAZOLE (FLAGYL) 500 MG tablet   Oral   Take 1 tablet (500 mg total) by mouth 2 (two) times daily.   14 tablet   0     There were no vitals taken for this visit.  Physical Exam  Nursing note and vitals reviewed. Constitutional: She is oriented to person, place, and time. She appears well-developed and well-nourished. No distress.  HENT:  Head: Normocephalic and atraumatic.  Eyes: EOM are normal.  Neck: Neck supple. No tracheal deviation present.  Cardiovascular: Normal rate.   Pulmonary/Chest: Effort normal. No respiratory distress.  Musculoskeletal: Normal range of motion. She exhibits tenderness.  Left 5th finger appears dislocated and is swollen at PIP joint. Good Capillary refill in left 5th finger. Tenderness over PIP joint and distal finger. No pain with movement at MCP joint. Rest of left hand is normal.  Neurological: She is alert and oriented to person, place, and time.  Skin: Skin is warm and dry.  Psychiatric: She has a normal mood and affect. Her behavior is normal.    ED Course  Procedures (including critical care time)  DIAGNOSTIC STUDIES: Oxygen Saturation is 99% on room air, normal by my interpretation.    COORDINATION OF CARE: 11:30 PM Discussed ED treatment with pt  and pt agrees.    Dg Finger Little Left  12/13/2012  *RADIOLOGY REPORT*  Clinical Data: Larey Seat on left fifth finger, with left fifth finger pain and swelling.  LEFT LITTLE FINGER 2+V  Comparison: None.  Findings: There is a mildly displaced fracture involving the volar and radial aspect of the base of the fifth middle phalanx, with mild subluxation of both fragments, and question of slight comminution.  This extends to the joint space, with 3 mm of separation noted on the lateral view.  Surrounding soft tissue swelling is noted.  No additional fractures are seen.  Remaining visualized joint spaces are preserved.  IMPRESSION: Mildly displaced fracture  involving the volar and radial aspect of the base of the fifth middle phalanx, with mild subluxation of both fragments, and question of slight comminution.  There is intra- articular extension, with 3 mm of separation noted on the lateral view.   Original Report Authenticated By: Tonia Ghent, M.D.       Labs Reviewed - No data to display No results found.   1. Finger fracture, left, closed, initial encounter       MDM  X-ray as above. Finger neurovascularly intact. Good cap refill distally. Finger splinted. norco given for pain. Will need hand follow up. D/c home with pain medications and follow up with Dr. Mina Marble.      I personally performed the services described in this documentation, which was scribed in my presence. The recorded information has been reviewed and is accurate.    Lottie Mussel, PA-C 12/13/12 [phone]

## 2012-12-12 NOTE — ED Notes (Signed)
Running up steps, lost balance, cought self with left 5th digit, deformity noted

## 2012-12-13 MED ORDER — HYDROCODONE-ACETAMINOPHEN 5-325 MG PO TABS
1.0000 | ORAL_TABLET | Freq: Four times a day (QID) | ORAL | Status: DC | PRN
Start: 2012-12-13 — End: 2013-05-02

## 2012-12-13 MED ORDER — HYDROCODONE-ACETAMINOPHEN 5-325 MG PO TABS
1.0000 | ORAL_TABLET | Freq: Once | ORAL | Status: AC
  Administered 2012-12-13: 1 via ORAL
  Filled 2012-12-13: qty 1

## 2012-12-13 MED ORDER — IBUPROFEN 600 MG PO TABS: 600.0000 mg | ORAL_TABLET | Freq: Four times a day (QID) | ORAL | Status: DC | PRN

## 2012-12-13 NOTE — ED Provider Notes (Signed)
Medical screening examination/treatment/procedure(s) were performed by non-physician practitioner and as supervising physician I was immediately available for consultation/collaboration.   Hanley Seamen, MD 12/13/12 [phone]

## 2012-12-18 ENCOUNTER — Other Ambulatory Visit: Payer: Self-pay | Admitting: Orthopedic Surgery

## 2012-12-18 ENCOUNTER — Encounter (HOSPITAL_BASED_OUTPATIENT_CLINIC_OR_DEPARTMENT_OTHER): Payer: Self-pay | Admitting: *Deleted

## 2012-12-19 ENCOUNTER — Encounter (HOSPITAL_BASED_OUTPATIENT_CLINIC_OR_DEPARTMENT_OTHER): Payer: Self-pay | Admitting: *Deleted

## 2012-12-19 ENCOUNTER — Ambulatory Visit (HOSPITAL_BASED_OUTPATIENT_CLINIC_OR_DEPARTMENT_OTHER): Payer: Medicaid Other | Admitting: Anesthesiology

## 2012-12-19 ENCOUNTER — Ambulatory Visit (HOSPITAL_BASED_OUTPATIENT_CLINIC_OR_DEPARTMENT_OTHER)
Admission: RE | Admit: 2012-12-19 | Discharge: 2012-12-19 | Disposition: A | Discharge status: Home / Self Care | Payer: Medicaid Other | Source: Ambulatory Visit | Attending: Orthopedic Surgery | Admitting: Orthopedic Surgery

## 2012-12-19 ENCOUNTER — Encounter (HOSPITAL_BASED_OUTPATIENT_CLINIC_OR_DEPARTMENT_OTHER): Payer: Self-pay | Admitting: Anesthesiology

## 2012-12-19 ENCOUNTER — Encounter (HOSPITAL_BASED_OUTPATIENT_CLINIC_OR_DEPARTMENT_OTHER)
Admission: RE | Disposition: A | Discharge status: Home / Self Care | Payer: Self-pay | Source: Ambulatory Visit | Attending: Orthopedic Surgery

## 2012-12-19 DIAGNOSIS — IMO0002 Reserved for concepts with insufficient information to code with codable children: Secondary | ICD-10-CM | POA: Insufficient documentation

## 2012-12-19 DIAGNOSIS — S62609A Fracture of unspecified phalanx of unspecified finger, initial encounter for closed fracture: Secondary | ICD-10-CM

## 2012-12-19 DIAGNOSIS — Z7982 Long term (current) use of aspirin: Secondary | ICD-10-CM | POA: Insufficient documentation

## 2012-12-19 DIAGNOSIS — G43909 Migraine, unspecified, not intractable, without status migrainosus: Secondary | ICD-10-CM | POA: Insufficient documentation

## 2012-12-19 DIAGNOSIS — W19XXXA Unspecified fall, initial encounter: Secondary | ICD-10-CM | POA: Insufficient documentation

## 2012-12-19 DIAGNOSIS — J45909 Unspecified asthma, uncomplicated: Secondary | ICD-10-CM | POA: Insufficient documentation

## 2012-12-19 DIAGNOSIS — I10 Essential (primary) hypertension: Secondary | ICD-10-CM | POA: Insufficient documentation

## 2012-12-19 DIAGNOSIS — J309 Allergic rhinitis, unspecified: Secondary | ICD-10-CM | POA: Insufficient documentation

## 2012-12-19 DIAGNOSIS — Z79899 Other long term (current) drug therapy: Secondary | ICD-10-CM | POA: Insufficient documentation

## 2012-12-19 HISTORY — DX: Other seasonal allergic rhinitis: J30.2

## 2012-12-19 HISTORY — PX: OPEN REDUCTION INTERNAL FIXATION (ORIF) METACARPAL: SHX6234

## 2012-12-19 HISTORY — DX: Fracture of unspecified phalanx of unspecified finger, initial encounter for closed fracture: S62.609A

## 2012-12-19 HISTORY — DX: Headache: R51

## 2012-12-19 LAB — POCT HEMOGLOBIN-HEMACUE: Hemoglobin: 14.1 g/dL (ref 12.0–15.0)

## 2012-12-19 SURGERY — OPEN REDUCTION INTERNAL FIXATION (ORIF) METACARPAL
Anesthesia: General | Site: Finger | Laterality: Left | Wound class: Clean

## 2012-12-19 MED ORDER — CHLORHEXIDINE GLUCONATE 4 % EX LIQD: 60.0000 mL | Freq: Once | CUTANEOUS | Status: DC

## 2012-12-19 MED ORDER — LACTATED RINGERS IV SOLN
INTRAVENOUS | Status: DC
  Administered 2012-12-19: 09:00:00 via INTRAVENOUS

## 2012-12-19 MED ORDER — HYDROMORPHONE HCL PF 1 MG/ML IJ SOLN
0.2500 mg | INTRAMUSCULAR | Status: DC | PRN
  Administered 2012-12-19 (×4): 0.5 mg via INTRAVENOUS

## 2012-12-19 MED ORDER — BUPIVACAINE HCL (PF) 0.25 % IJ SOLN
INTRAMUSCULAR | Status: DC | PRN
  Administered 2012-12-19: 5 mL

## 2012-12-19 MED ORDER — OXYCODONE HCL 5 MG PO TABS: 5.0000 mg | ORAL_TABLET | Freq: Once | ORAL | Status: DC | PRN

## 2012-12-19 MED ORDER — MIDAZOLAM HCL 2 MG/ML PO SYRP: 12.0000 mg | ORAL_SOLUTION | Freq: Once | ORAL | Status: DC | PRN

## 2012-12-19 MED ORDER — PROPOFOL 10 MG/ML IV BOLUS
INTRAVENOUS | Status: DC | PRN
  Administered 2012-12-19: 200 mg via INTRAVENOUS

## 2012-12-19 MED ORDER — ONDANSETRON HCL 4 MG/2ML IJ SOLN
INTRAMUSCULAR | Status: DC | PRN
  Administered 2012-12-19: 4 mg via INTRAVENOUS

## 2012-12-19 MED ORDER — LIDOCAINE HCL (CARDIAC) 10 MG/ML IV SOLN
INTRAVENOUS | Status: DC | PRN
  Administered 2012-12-19: 60 mg via INTRAVENOUS

## 2012-12-19 MED ORDER — CEFAZOLIN SODIUM-DEXTROSE 2-3 GM-% IV SOLR
2.0000 g | INTRAVENOUS | Status: AC
  Administered 2012-12-19: 2 g via INTRAVENOUS

## 2012-12-19 MED ORDER — MIDAZOLAM HCL 2 MG/2ML IJ SOLN: 1.0000 mg | INTRAMUSCULAR | Status: DC | PRN

## 2012-12-19 MED ORDER — OXYCODONE-ACETAMINOPHEN 5-325 MG PO TABS: 1.0000 | ORAL_TABLET | ORAL | Status: DC | PRN

## 2012-12-19 MED ORDER — FENTANYL CITRATE 0.05 MG/ML IJ SOLN
INTRAMUSCULAR | Status: DC | PRN
  Administered 2012-12-19: 100 ug via INTRAVENOUS

## 2012-12-19 MED ORDER — OXYCODONE HCL 5 MG/5ML PO SOLN: 5.0000 mg | Freq: Once | ORAL | Status: DC | PRN

## 2012-12-19 MED ORDER — DEXAMETHASONE SODIUM PHOSPHATE 4 MG/ML IJ SOLN
INTRAMUSCULAR | Status: DC | PRN
  Administered 2012-12-19: 10 mg via INTRAVENOUS

## 2012-12-19 MED ORDER — FENTANYL CITRATE 0.05 MG/ML IJ SOLN: 50.0000 ug | INTRAMUSCULAR | Status: DC | PRN

## 2012-12-19 MED ORDER — MIDAZOLAM HCL 5 MG/5ML IJ SOLN
INTRAMUSCULAR | Status: DC | PRN
  Administered 2012-12-19: 2 mg via INTRAVENOUS

## 2012-12-19 SURGICAL SUPPLY — 59 items
APL SKNCLS STERI-STRIP NONHPOA (GAUZE/BANDAGES/DRESSINGS)
BANDAGE ELASTIC 3 VELCRO ST LF (GAUZE/BANDAGES/DRESSINGS) IMPLANT
BANDAGE ELASTIC 4 VELCRO ST LF (GAUZE/BANDAGES/DRESSINGS) ×2 IMPLANT
BANDAGE GAUZE ELAST BULKY 4 IN (GAUZE/BANDAGES/DRESSINGS) ×2 IMPLANT
BENZOIN TINCTURE PRP APPL 2/3 (GAUZE/BANDAGES/DRESSINGS) IMPLANT
BLADE SURG 15 STRL LF DISP TIS (BLADE) ×1 IMPLANT
BLADE SURG 15 STRL SS (BLADE) ×2
BNDG CMPR 9X4 STRL LF SNTH (GAUZE/BANDAGES/DRESSINGS) ×1
BNDG CMPR MD 5X2 ELC HKLP STRL (GAUZE/BANDAGES/DRESSINGS)
BNDG ELASTIC 2 VLCR STRL LF (GAUZE/BANDAGES/DRESSINGS) IMPLANT
BNDG ESMARK 4X9 LF (GAUZE/BANDAGES/DRESSINGS) ×2 IMPLANT
CANISTER SUCTION 1200CC (MISCELLANEOUS) IMPLANT
CLOTH BEACON ORANGE TIMEOUT ST (SAFETY) ×2 IMPLANT
CORDS BIPOLAR (ELECTRODE) IMPLANT
COVER TABLE BACK 60X90 (DRAPES) ×2 IMPLANT
CUFF TOURNIQUET SINGLE 18IN (TOURNIQUET CUFF) ×2 IMPLANT
DECANTER SPIKE VIAL GLASS SM (MISCELLANEOUS) IMPLANT
DRAPE EXTREMITY T 121X128X90 (DRAPE) ×2 IMPLANT
DRAPE OEC MINIVIEW 54X84 (DRAPES) ×2 IMPLANT
DRAPE SURG 17X23 STRL (DRAPES) ×2 IMPLANT
DURAPREP 26ML APPLICATOR (WOUND CARE) ×2 IMPLANT
GAUZE SPONGE 4X4 16PLY XRAY LF (GAUZE/BANDAGES/DRESSINGS) IMPLANT
GAUZE XEROFORM 1X8 LF (GAUZE/BANDAGES/DRESSINGS) ×2 IMPLANT
GLOVE BIO SURGEON STRL SZ 6.5 (GLOVE) ×2 IMPLANT
GLOVE BIO SURGEON STRL SZ8 (GLOVE) ×2 IMPLANT
GLOVE BIOGEL PI IND STRL 7.0 (GLOVE) ×1 IMPLANT
GLOVE BIOGEL PI INDICATOR 7.0 (GLOVE) ×1
GOWN PREVENTION PLUS XLARGE (GOWN DISPOSABLE) ×2 IMPLANT
GOWN PREVENTION PLUS XXLARGE (GOWN DISPOSABLE) ×2 IMPLANT
K-WIRE .035X4 (WIRE) ×2 IMPLANT
NEEDLE HYPO 25X1 1.5 SAFETY (NEEDLE) IMPLANT
NS IRRIG 1000ML POUR BTL (IV SOLUTION) IMPLANT
PACK BASIN DAY SURGERY FS (CUSTOM PROCEDURE TRAY) ×2 IMPLANT
PAD CAST 3X4 CTTN HI CHSV (CAST SUPPLIES) ×1 IMPLANT
PAD CAST 4YDX4 CTTN HI CHSV (CAST SUPPLIES) ×1 IMPLANT
PADDING CAST ABS 4INX4YD NS (CAST SUPPLIES) ×1
PADDING CAST ABS COTTON 4X4 ST (CAST SUPPLIES) ×1 IMPLANT
PADDING CAST COTTON 3X4 STRL (CAST SUPPLIES) ×2
PADDING CAST COTTON 4X4 STRL (CAST SUPPLIES) ×2
PADDING UNDERCAST 2  STERILE (CAST SUPPLIES) IMPLANT
SHEET MEDIUM DRAPE 40X70 STRL (DRAPES) ×2 IMPLANT
SPLINT PLASTER CAST XFAST 4X15 (CAST SUPPLIES) ×15 IMPLANT
SPLINT PLASTER XTRA FAST SET 4 (CAST SUPPLIES) ×15
SPONGE GAUZE 4X4 12PLY (GAUZE/BANDAGES/DRESSINGS) ×2 IMPLANT
STOCKINETTE 4X48 STRL (DRAPES) ×2 IMPLANT
STRIP CLOSURE SKIN 1/2X4 (GAUZE/BANDAGES/DRESSINGS) IMPLANT
SUCTION FRAZIER TIP 10 FR DISP (SUCTIONS) IMPLANT
SUT ETHILON 4 0 PS 2 18 (SUTURE) IMPLANT
SUT ETHILON 5 0 PS 2 18 (SUTURE) IMPLANT
SUT MERSILENE 4 0 P 3 (SUTURE) IMPLANT
SUT VIC AB 4-0 P-3 18XBRD (SUTURE) IMPLANT
SUT VIC AB 4-0 P3 18 (SUTURE)
SUT VICRYL 4-0 PS2 18IN ABS (SUTURE) ×2 IMPLANT
SUT VICRYL RAPIDE 4/0 PS 2 (SUTURE) ×2 IMPLANT
SYR BULB 3OZ (MISCELLANEOUS) IMPLANT
SYRINGE 10CC LL (SYRINGE) ×2 IMPLANT
TOWEL OR 17X24 6PK STRL BLUE (TOWEL DISPOSABLE) ×2 IMPLANT
TUBE CONNECTING 20X1/4 (TUBING) IMPLANT
UNDERPAD 30X30 INCONTINENT (UNDERPADS AND DIAPERS) ×2 IMPLANT

## 2012-12-19 NOTE — Anesthesia Procedure Notes (Addendum)
Anesthesia Regional Block:   Narrative:    Procedure Name: LMA Insertion Date/Time: 12/19/2012 9:54 AM Performed by: Verlan Friends Pre-anesthesia Checklist: Patient identified, Emergency Drugs available, Suction available, Patient being monitored and Timeout performed Patient Re-evaluated:Patient Re-evaluated prior to inductionOxygen Delivery Method: Circle System Utilized Preoxygenation: Pre-oxygenation with 100% oxygen Intubation Type: IV induction Ventilation: Mask ventilation without difficulty LMA: LMA inserted LMA Size: 4.0 Number of attempts: 1 Airway Equipment and Method: bite block Placement Confirmation: positive ETCO2 Tube secured with: Tape Dental Injury: Teeth and Oropharynx as per pre-operative assessment

## 2012-12-19 NOTE — Transfer of Care (Signed)
Immediate Anesthesia Transfer of Care Note  Patient: Cassandra Nicholson  Procedure(s) Performed: Procedure(s): CLOSED REDUCTION PINNING LEFT SMALL P2 FRACTURE  (Left)  Patient Location: PACU  Anesthesia Type:General  Level of Consciousness: sedated and unresponsive  Airway & Oxygen Therapy: Patient Spontanous Breathing and Patient connected to face mask oxygen  Post-op Assessment: Report given to PACU RN and Post -op Vital signs reviewed and stable  Post vital signs: Reviewed and stable  Complications: No apparent anesthesia complications

## 2012-12-19 NOTE — Op Note (Signed)
See dictated note [phone]

## 2012-12-19 NOTE — Anesthesia Postprocedure Evaluation (Signed)
  Anesthesia Post-op Note  Patient: Cassandra Nicholson  Procedure(s) Performed: Procedure(s): CLOSED REDUCTION PINNING LEFT SMALL P2 FRACTURE  (Left)  Patient Location: PACU  Anesthesia Type:General  Level of Consciousness: awake, alert  and oriented  Airway and Oxygen Therapy: Patient Spontanous Breathing and Patient connected to face mask oxygen  Post-op Pain: mild  Post-op Assessment: Post-op Vital signs reviewed  Post-op Vital Signs: Reviewed  Complications: No apparent anesthesia complications

## 2012-12-19 NOTE — H&P (Signed)
Cassandra Nicholson is an 27 y.o. female.   Chief Complaint: left small finger pain HPI: as above s/p fall onto left hand with displaced small p-2 base fracture  Past Medical History  Diagnosis Date  . Headache     migraines  . Seasonal allergies   . Hypertension     no current meds.  . Asthma     as a child  . Finger fracture, left 12/12/2012    left small P2 fx.    Past Surgical History  Procedure Laterality Date  . Tubal ligation  02/12/2008    History reviewed. No pertinent family history. Social History:  reports that she has been smoking Cigarettes.  She has a 6 pack-year smoking history. She has never used smokeless tobacco. She reports that  drinks alcohol. She reports that she does not use illicit drugs.  Allergies: No Known Allergies  Medications Prior to Admission  Medication Sig Dispense Refill  . Aspirin-Salicylamide-Caffeine (BC HEADACHE PO) Take 1 Package by mouth 2 (two) times daily as needed (pain or headache).      Marland Kitchen HYDROcodone-acetaminophen (NORCO) 5-325 MG per tablet Take 1 tablet by mouth every 6 (six) hours as needed for pain.  20 tablet  0  . ibuprofen (ADVIL,MOTRIN) 600 MG tablet Take 1 tablet (600 mg total) by mouth every 6 (six) hours as needed for pain.  30 tablet  0  . Pseudoephedrine-APAP-DM (DAYQUIL PO) Take 1 capsule by mouth 2 (two) times daily as needed (cold symptoms).        Results for orders placed during the hospital encounter of 12/19/12 (from the past 48 hour(s))  POCT HEMOGLOBIN-HEMACUE     Status: None   Collection Time    12/19/12  9:07 AM      Result Value Range   Hemoglobin 14.1  12.0 - 15.0 g/dL   No results found.  Review of Systems  All other systems reviewed and are negative.    Blood pressure 133/90, pulse 66, temperature 98.1 F (36.7 C), temperature source Oral, resp. rate 18, height 5\' 11"  (1.803 m), weight 79.89 kg (176 lb 2 oz), last menstrual period 12/08/2012, SpO2 99.00%. Physical Exam  Constitutional: She is  oriented to person, place, and time. She appears well-developed and well-nourished.  HENT:  Head: Normocephalic and atraumatic.  Cardiovascular: Normal rate.   Respiratory: Effort normal.  Musculoskeletal:       Left hand: She exhibits tenderness, bony tenderness and deformity.  Neurological: She is alert and oriented to person, place, and time.  Skin: Skin is warm.  Psychiatric: She has a normal mood and affect. Her behavior is normal. Judgment and thought content normal.     Assessment/Plan As above   Plan CRPP vs ORIF  Cassandra Nicholson A 12/19/2012, 9:35 AM

## 2012-12-19 NOTE — Anesthesia Preprocedure Evaluation (Signed)
Anesthesia Evaluation  Patient identified by MRN, date of birth, ID band Patient awake    Reviewed: Allergy & Precautions, H&P , NPO status , Patient's Chart, lab work & pertinent test results  Airway Mallampati: II TM Distance: >3 FB Neck ROM: Full    Dental no notable dental hx. (+) Teeth Intact and Dental Advisory Given   Pulmonary asthma ,  breath sounds clear to auscultation  Pulmonary exam normal       Cardiovascular hypertension, Rhythm:Regular Rate:Normal     Neuro/Psych  Headaches, negative psych ROS   GI/Hepatic negative GI ROS, Neg liver ROS,   Endo/Other  negative endocrine ROS  Renal/GU negative Renal ROS  negative genitourinary   Musculoskeletal   Abdominal   Peds  Hematology negative hematology ROS (+)   Anesthesia Other Findings   Reproductive/Obstetrics negative OB ROS                           Anesthesia Physical Anesthesia Plan  ASA: II  Anesthesia Plan: General   Post-op Pain Management:    Induction: Intravenous  Airway Management Planned: LMA  Additional Equipment:   Intra-op Plan:   Post-operative Plan: Extubation in OR  Informed Consent: I have reviewed the patients History and Physical, chart, labs and discussed the procedure including the risks, benefits and alternatives for the proposed anesthesia with the patient or authorized representative who has indicated his/her understanding and acceptance.   Dental advisory given  Plan Discussed with: CRNA  Anesthesia Plan Comments:         Anesthesia Quick Evaluation

## 2012-12-20 ENCOUNTER — Encounter (HOSPITAL_BASED_OUTPATIENT_CLINIC_OR_DEPARTMENT_OTHER): Payer: Self-pay | Admitting: Orthopedic Surgery

## 2012-12-20 NOTE — Op Note (Deleted)
NAME:  Cassandra Nicholson, Cassandra Nicholson         ACCOUNT NO.:  [id number]  MEDICAL RECORD NO.:  [id number]  LOCATION:  WTR7                         FACILITY:  [facility]  PHYSICIAN:  Ethyn Schetter A. Herald Vallin, Nicholson.D.DATE OF BIRTH:  06/25/1986  DATE OF PROCEDURE:  12/19/2012 DATE OF DISCHARGE:  12/19/2012                              OPERATIVE REPORT   PREOPERATIVE DIAGNOSIS:  Intra-articular fracture dislocation, left small finger proximal interphalangeal joint.  POSTOPERATIVE DIAGNOSIS:  Intra-articular fracture dislocation, left small finger proximal interphalangeal joint.  PROCEDURE:  Open reduction and internal fixation of above with 0.35 K- wires x2.  SURGEON:  Joanathan Affeldt A. Johnny Gorter, Nicholson.D.  ASSISTANT:  None.  ANESTHESIA:  General.  COMPLICATIONS:  None.  DRAINS:  None.  DESCRIPTION OF PROCEDURE:  The patient was taken to the operating suite. After induction of general anesthesia, left upper extremity prepped and draped in sterile fashion.  An Esmarch was used to exsanguinate the limb.  Tourniquet inflated to 250 mmHg.  At this point, longitudinal traction, slight pressure over the base of the proximal middle phalangeal base was applied.  Intraoperative fluoroscopy was used to aid in reduction volar displaced fracture at the base of the proximal phalanx at the PIP joint.  There was some slight subluxation of the joint dorsally.  Small nick was made in the skin and a Freer elevator and then, a small hemostat was used to elevate the depressed volar fragment.  Then, longitudinal traction downward pressure was used to achieve reduction at this point in time, two 0.35 K-wires were driven from volar to dorsal out to the fracture site, until the pins were smooth on the volar fragment and exiting the skin dorsally. Intraoperative fluoroscopy revealed adequate reduction AP, lateral and oblique view.  The joint was reduced, it was not subluxing dorsally and gross motion under fluoroscopy, the K-wires cut  outside the skin bent upon themselves.  The wound was irrigated and left open to heal.  It was then dressed with Xeroform, 4x4s, fluffs and radial ulnar gutter splint.  The patient tolerated the procedure well and went to the recovery room in stable fashion.     Lyrica Mcclarty A. Rajendra Spiller, Nicholson.D.     MAW/MEDQ  D:  12/19/2012  T:  12/20/2012  Job:  [id number] 

## 2012-12-20 NOTE — Op Note (Signed)
NAMEMarland Kitchen  Cassandra Nicholson, Cassandra Nicholson         ACCOUNT NO.:  [id number]  MEDICAL RECORD NO.:  [id number]  LOCATION:  WTR7                         FACILITY:  [facility]  PHYSICIAN:  Artist Pais. Venezia Sargeant, M.D.DATE OF BIRTH:  [birth year]  DATE OF PROCEDURE:  12/19/2012 DATE OF DISCHARGE:  12/19/2012                              OPERATIVE REPORT   PREOPERATIVE DIAGNOSIS:  Intra-articular fracture dislocation, left small finger proximal interphalangeal joint.  POSTOPERATIVE DIAGNOSIS:  Intra-articular fracture dislocation, left small finger proximal interphalangeal joint.  PROCEDURE:  Open reduction and internal fixation of above with 0.35 K- wires x2.  SURGEON:  Artist Pais. Mina Marble, M.D.  ASSISTANT:  None.  ANESTHESIA:  General.  COMPLICATIONS:  None.  DRAINS:  None.  DESCRIPTION OF PROCEDURE:  The patient was taken to the operating suite. After induction of general anesthesia, left upper extremity prepped and draped in sterile fashion.  An Esmarch was used to exsanguinate the limb.  Tourniquet inflated to 250 mmHg.  At this point, longitudinal traction, slight pressure over the base of the proximal middle phalangeal base was applied.  Intraoperative fluoroscopy was used to aid in reduction volar displaced fracture at the base of the proximal phalanx at the PIP joint.  There was some slight subluxation of the joint dorsally.  Small nick was made in the skin and a Therapist, nutritional and then, a small hemostat was used to elevate the depressed volar fragment.  Then, longitudinal traction downward pressure was used to achieve reduction at this point in time, two 0.35 K-wires were driven from volar to dorsal out to the fracture site, until the pins were smooth on the volar fragment and exiting the skin dorsally. Intraoperative fluoroscopy revealed adequate reduction AP, lateral and oblique view.  The joint was reduced, it was not subluxing dorsally and gross motion under fluoroscopy, the K-wires cut  outside the skin bent upon themselves.  The wound was irrigated and left open to heal.  It was then dressed with Xeroform, 4x4s, fluffs and radial ulnar gutter splint.  The patient tolerated the procedure well and went to the recovery room in stable fashion.     Artist Pais Mina Marble, M.D.     MAW/MEDQ  D:  12/19/2012  T:  12/20/2012  Job:  [id number]

## 2012-12-27 ENCOUNTER — Ambulatory Visit: Payer: Medicaid Other | Attending: Orthopedic Surgery | Admitting: Occupational Therapy

## 2012-12-27 DIAGNOSIS — Z4789 Encounter for other orthopedic aftercare: Secondary | ICD-10-CM | POA: Insufficient documentation

## 2012-12-27 DIAGNOSIS — M25549 Pain in joints of unspecified hand: Secondary | ICD-10-CM | POA: Insufficient documentation

## 2012-12-27 DIAGNOSIS — IMO0001 Reserved for inherently not codable concepts without codable children: Secondary | ICD-10-CM | POA: Insufficient documentation

## 2013-01-10 ENCOUNTER — Ambulatory Visit: Payer: Medicaid Other | Admitting: Occupational Therapy

## 2013-05-02 ENCOUNTER — Encounter (HOSPITAL_COMMUNITY): Payer: Self-pay | Admitting: Emergency Medicine

## 2013-05-02 ENCOUNTER — Emergency Department (HOSPITAL_COMMUNITY)
Admission: EM | Admit: 2013-05-02 | Discharge: 2013-05-03 | Disposition: A | Discharge status: Home / Self Care | Payer: Medicaid Other | Attending: Emergency Medicine | Admitting: Emergency Medicine

## 2013-05-02 ENCOUNTER — Emergency Department (HOSPITAL_COMMUNITY): Payer: Medicaid Other

## 2013-05-02 DIAGNOSIS — I10 Essential (primary) hypertension: Secondary | ICD-10-CM | POA: Insufficient documentation

## 2013-05-02 DIAGNOSIS — W268XXA Contact with other sharp object(s), not elsewhere classified, initial encounter: Secondary | ICD-10-CM | POA: Insufficient documentation

## 2013-05-02 DIAGNOSIS — J45909 Unspecified asthma, uncomplicated: Secondary | ICD-10-CM | POA: Insufficient documentation

## 2013-05-02 DIAGNOSIS — S81811A Laceration without foreign body, right lower leg, initial encounter: Secondary | ICD-10-CM

## 2013-05-02 DIAGNOSIS — Z23 Encounter for immunization: Secondary | ICD-10-CM | POA: Insufficient documentation

## 2013-05-02 DIAGNOSIS — S81009A Unspecified open wound, unspecified knee, initial encounter: Secondary | ICD-10-CM | POA: Insufficient documentation

## 2013-05-02 DIAGNOSIS — W01119A Fall on same level from slipping, tripping and stumbling with subsequent striking against unspecified sharp object, initial encounter: Secondary | ICD-10-CM | POA: Insufficient documentation

## 2013-05-02 DIAGNOSIS — M795 Residual foreign body in soft tissue: Secondary | ICD-10-CM | POA: Insufficient documentation

## 2013-05-02 DIAGNOSIS — F17200 Nicotine dependence, unspecified, uncomplicated: Secondary | ICD-10-CM | POA: Insufficient documentation

## 2013-05-02 DIAGNOSIS — Y9302 Activity, running: Secondary | ICD-10-CM | POA: Insufficient documentation

## 2013-05-02 DIAGNOSIS — Y92009 Unspecified place in unspecified non-institutional (private) residence as the place of occurrence of the external cause: Secondary | ICD-10-CM | POA: Insufficient documentation

## 2013-05-02 DIAGNOSIS — Z8679 Personal history of other diseases of the circulatory system: Secondary | ICD-10-CM | POA: Insufficient documentation

## 2013-05-02 DIAGNOSIS — Z8781 Personal history of (healed) traumatic fracture: Secondary | ICD-10-CM | POA: Insufficient documentation

## 2013-05-02 MED ORDER — TETANUS-DIPHTH-ACELL PERTUSSIS 5-2.5-18.5 LF-MCG/0.5 IM SUSP
0.5000 mL | Freq: Once | INTRAMUSCULAR | Status: AC
  Administered 2013-05-02: 0.5 mL via INTRAMUSCULAR
  Filled 2013-05-02: qty 0.5

## 2013-05-02 MED ORDER — HYDROMORPHONE HCL PF 1 MG/ML IJ SOLN
0.4000 mg | Freq: Once | INTRAMUSCULAR | Status: AC
  Administered 2013-05-02: 0.4 mg via INTRAVENOUS
  Filled 2013-05-02: qty 1

## 2013-05-02 MED ORDER — HYDROMORPHONE HCL PF 1 MG/ML IJ SOLN: 0.5000 mg | Freq: Once | INTRAMUSCULAR | Status: DC

## 2013-05-02 MED ORDER — OXYCODONE-ACETAMINOPHEN 5-325 MG PO TABS: 1.0000 | ORAL_TABLET | Freq: Once | ORAL | Status: DC

## 2013-05-02 MED ORDER — HYDROMORPHONE HCL PF 1 MG/ML IJ SOLN
1.0000 mg | Freq: Once | INTRAMUSCULAR | Status: AC
  Administered 2013-05-02: 1 mg via INTRAVENOUS
  Filled 2013-05-02: qty 1

## 2013-05-02 NOTE — ED Notes (Signed)
Pt brought to ED by EMS with fall.As per EMS pt was walking in the field near her home and fell on glass pieces.No LOC .Lacerations with glass pieces in the rt leg.Pt had of fentanyl for pain.

## 2013-05-02 NOTE — ED Provider Notes (Signed)
Cassandra Nicholson is a 27 y.o. female    Face-to-face evaluation   History: She states that she was running from a dog and fell into a ditch, cutting her leg. She continued to run afterwards. She did not know that she cut herself until she saw the bleeding. She believes that it was cut on glass.  Physical exam: Right lower leg with multiple gaping lacerations, anteriorly and posteriorly. She has a sensation of foreign body in the wound below the right knee, not in the other wounds. She is neurovascularly intact distally.  Medical screening examination/treatment/procedure(s) were conducted as a shared visit with non-physician practitioner(s) and myself.  I personally evaluated the patient during the encounter  Flint Melter, MD 05/04/13 1406

## 2013-05-02 NOTE — ED Notes (Signed)
Pt on the phone and asking for pain medications.

## 2013-05-02 NOTE — ED Provider Notes (Signed)
CSN: [id number]     Arrival date & time 05/02/13  2108 History   First MD Initiated Contact with Patient 05/02/13 2123     Chief Complaint  Patient presents with  . Fall    HPI  Cassandra Nicholson is a 27 y.o. female with a PMH of HTN, asthma, and headache who presents to the ED for evaluation of a fall.  Patient states that about 1 hour prior to arrival she was running in a ditch when she fell on some broken glass.  She sustained injuries to her right lower leg.  She denies removing a broken glass.  She is complaining of some left lower extremity numbness and tingling. She denies any loss of sensation or motor function.  She denies any other injuries including a head injury with loss of consciousness.  She denies any dizziness, lightheadedness, neck pain, abdominal pain, nausea, vomiting, or pelvic pain.  Patient got fentanyl in route to the emergency department. She states that it helped initially however her pain is a 10 out 10. She did not do anything to clean the wound prior to arrival.  She states her tetanus is not up to date.  She has not had her normal menstrual period in three months.  She has a hx of tubal ligation.      Past Medical History  Diagnosis Date  . Headache(784.0)     migraines  . Seasonal allergies   . Hypertension     no current meds.  . Asthma     as a child  . Finger fracture, left 12/12/2012    left small P2 fx.   Past Surgical History  Procedure Laterality Date  . Tubal ligation  02/12/2008  . Open reduction internal fixation (orif) metacarpal Left 12/19/2012    Procedure: CLOSED REDUCTION PINNING LEFT SMALL P2 FRACTURE ;  Surgeon: Marlowe Shores, MD;  Location: [facility];  Service: Orthopedics;  Laterality: Left;   No family history on file. History  Substance Use Topics  . Smoking status: Current Every Day Smoker -- 0.50 packs/day for 12 years    Types: Cigarettes  . Smokeless tobacco: Never Used  . Alcohol Use: 0.0 oz/week      Comment: states socially.   OB History   Grav Para Term Preterm Abortions TAB SAB Ect Mult Living   4 3 2 1 1 1  0 0 0 3     Review of Systems  Constitutional: Negative for diaphoresis.  HENT: Negative for sore throat, neck pain, neck stiffness and dental problem.   Eyes: Negative for photophobia and visual disturbance.  Respiratory: Negative for shortness of breath.   Cardiovascular: Negative for chest pain and leg swelling.  Gastrointestinal: Negative for nausea, vomiting and abdominal pain.  Genitourinary: Negative for dysuria and pelvic pain.  Musculoskeletal: Negative for myalgias, back pain, joint swelling, arthralgias and gait problem.  Skin: Positive for wound.  Neurological: Negative for dizziness, syncope, weakness, light-headedness, numbness and headaches.  Psychiatric/Behavioral: Negative for confusion.    Allergies  Review of patient's allergies indicates no known allergies.  Home Medications  No current outpatient prescriptions on file. BP 126/100  Pulse 99  Temp(Src) 99 F (37.2 C) (Oral)  Resp 20  SpO2 100%  Filed Vitals:   05/02/13 2113 05/03/13 0159  BP: 126/100 125/82  Pulse: 99 89  Temp: 99 F (37.2 C)   TempSrc: Oral   Resp: 20 16  SpO2: 100% 100%     Physical Exam  Nursing note and vitals reviewed. Constitutional: She is oriented to person, place, and time. She appears well-developed and well-nourished. No distress.  HENT:  Head: Normocephalic and atraumatic.  Right Ear: External ear normal.  Left Ear: External ear normal.  Nose: Nose normal.  Mouth/Throat: Oropharynx is clear and moist. No oropharyngeal exudate.  No hematoma or laceration to the scalp throughout  Eyes: Conjunctivae and EOM are normal. Pupils are equal, round, and reactive to light. Right eye exhibits no discharge. Left eye exhibits no discharge.  Neck: Normal range of motion. Neck supple.  No cervical spinal or paraspinal tenderness to palpation  Cardiovascular: Normal  rate, regular rhythm, normal heart sounds and intact distal pulses.  Exam reveals no gallop and no friction rub.   No murmur heard. Dorsalis pedis pulses present and equal bilaerally  Pulmonary/Chest: Effort normal and breath sounds normal. No respiratory distress. She has no wheezes. She has no rales. She exhibits no tenderness.  Abdominal: Soft. Bowel sounds are normal. She exhibits no distension. There is no tenderness. There is no rebound and no guarding.  Musculoskeletal: Normal range of motion. She exhibits no edema and no tenderness.  No limitations with right knee flexion or extension.  No limitations with right ankle circumduction. No tenderness to the right foot throughout.   Neurological: She is alert and oriented to person, place, and time.  GCS 15. No focal neurological deficits. Strength 5/5 in the lower extremities bilaterally.  Gross sensation intact in the lower extremities bilaterally.   Skin: Skin is warm and dry. She is not diaphoretic.     9 horizontal and oblique linear lacerations to the right anterior and posterior leg inferior to the knee to the distal tibia/fibula.  A few lacerations are actively bleeding. No surrounding edema, ecchymosis, or erythema to the wounds throughout.      ED Course  LACERATION REPAIR Date/Time: 05/02/2013 12:05 AM Performed by: Coral Ceo K Authorized by: Jillyn Ledger Consent: Verbal consent obtained. Risks and benefits: risks, benefits and alternatives were discussed Consent given by: patient Patient identity confirmed: verbally with patient Body area: lower extremity Location details: right lower leg Laceration length: 3 cm Foreign bodies: no foreign bodies Tendon involvement: none Nerve involvement: none Vascular damage: no Anesthesia: local infiltration Local anesthetic: lidocaine 2% with epinephrine Anesthetic total: 3 ml Patient sedated: no Preparation: Patient was prepped and draped in the usual sterile  fashion. Irrigation solution: saline Irrigation method: jet lavage Amount of cleaning: extensive Debridement: none Degree of undermining: none Skin closure: 4-0 Prolene Number of sutures: 3 Technique: simple Approximation: close Approximation difficulty: simple Dressing: antibiotic ointment and gauze roll Patient tolerance: Patient tolerated the procedure well with no immediate complications.  LACERATION REPAIR Date/Time: 05/02/2013 12:05 AM Performed by: Coral Ceo K Authorized by: Coral Ceo K Body area: lower extremity Location details: right lower leg Laceration length: 4 cm Foreign bodies: no foreign bodies Tendon involvement: none Nerve involvement: none Vascular damage: no Anesthesia: local infiltration Local anesthetic: lidocaine 2% with epinephrine Anesthetic total: 5 ml Patient sedated: no Preparation: Patient was prepped and draped in the usual sterile fashion. Irrigation solution: saline Irrigation method: jet lavage Amount of cleaning: extensive Debridement: none Degree of undermining: none Skin closure: 4-0 Prolene Number of sutures: 6 Technique: simple Approximation: close Approximation difficulty: simple Dressing: antibiotic ointment and gauze roll Patient tolerance: Patient tolerated the procedure well with no immediate complications.  LACERATION REPAIR Date/Time: 05/02/2013 12:05 AM Performed by: Coral Ceo K Authorized by: Jillyn Ledger Body  area: lower extremity Location details: right lower leg Laceration length: 5 cm Foreign bodies: no foreign bodies Tendon involvement: none Nerve involvement: none Vascular damage: no Anesthesia: local infiltration Local anesthetic: lidocaine 2% with epinephrine Anesthetic total: 6 ml Patient sedated: no Preparation: Patient was prepped and draped in the usual sterile fashion. Irrigation solution: saline Irrigation method: jet lavage Amount of cleaning: extensive Debridement:  none Degree of undermining: none Skin closure: 4-0 Prolene Subcutaneous closure: 4-0 Vicryl Number of sutures: 8 Technique: simple Approximation: close Approximation difficulty: simple Dressing: antibiotic ointment and gauze roll Patient tolerance: Patient tolerated the procedure well with no immediate complications.  LACERATION REPAIR Date/Time: 05/02/2013 12:05 AM Performed by: Coral Ceo K Authorized by: Coral Ceo K Body area: lower extremity Location details: right lower leg Laceration length: 3 cm Foreign bodies: no foreign bodies Tendon involvement: none Nerve involvement: none Vascular damage: no Anesthesia: local infiltration Local anesthetic: lidocaine 2% with epinephrine Anesthetic total: 4 ml Patient sedated: no Preparation: Patient was prepped and draped in the usual sterile fashion. Irrigation solution: saline Irrigation method: jet lavage Amount of cleaning: standard Debridement: none Degree of undermining: none Skin closure: 4-0 Prolene Number of sutures: 3 Technique: simple Approximation: close Approximation difficulty: simple Dressing: antibiotic ointment Patient tolerance: Patient tolerated the procedure well with no immediate complications.  LACERATION REPAIR Date/Time: 05/02/2013 12:05 AM Performed by: Coral Ceo K Authorized by: Coral Ceo K Body area: lower extremity Location details: right lower leg Laceration length: 3 cm Foreign bodies: no foreign bodies Tendon involvement: none Nerve involvement: none Vascular damage: no Anesthesia: local infiltration Local anesthetic: lidocaine 2% with epinephrine Anesthetic total: 5 ml Patient sedated: no Preparation: Patient was prepped and draped in the usual sterile fashion. Irrigation solution: saline Irrigation method: jet lavage Amount of cleaning: extensive Debridement: none Degree of undermining: none Skin closure: 4-0 Prolene Number of sutures: 3 Technique:  simple Approximation: close Approximation difficulty: simple Dressing: antibiotic ointment and gauze roll Patient tolerance: Patient tolerated the procedure well with no immediate complications.  LACERATION REPAIR Date/Time: 05/02/2013 12:05 AM Performed by: Coral Ceo K Authorized by: Coral Ceo K Body area: lower extremity Location details: right lower leg Laceration length: 4 cm Foreign bodies: no foreign bodies Tendon involvement: none Nerve involvement: none Vascular damage: no Anesthesia: local infiltration Local anesthetic: lidocaine 2% with epinephrine Anesthetic total: 4 ml Patient sedated: no Irrigation solution: saline Irrigation method: jet lavage Amount of cleaning: extensive Debridement: none Degree of undermining: none Skin closure: 4-0 Prolene Number of sutures: 4 Technique: simple Approximation: close Approximation difficulty: simple Dressing: antibiotic ointment and gauze roll Patient tolerance: Patient tolerated the procedure well with no immediate complications.  LACERATION REPAIR Date/Time: 05/02/2013 12:05 AM Performed by: Coral Ceo K Authorized by: Coral Ceo K Body area: lower extremity Location details: right lower leg Laceration length: 7 cm Foreign bodies: no foreign bodies Tendon involvement: none Nerve involvement: none Vascular damage: no Anesthesia: local infiltration Local anesthetic: lidocaine 2% with epinephrine Anesthetic total: 6 ml Patient sedated: no Preparation: Patient was prepped and draped in the usual sterile fashion. Irrigation solution: saline Irrigation method: jet lavage Amount of cleaning: extensive Debridement: none Degree of undermining: none Skin closure: 4-0 Prolene Subcutaneous closure: 4-0 Vicryl Number of sutures: 7 Technique: simple Approximation: close Approximation difficulty: simple Dressing: antibiotic ointment and gauze roll Patient tolerance: Patient tolerated the procedure  well with no immediate complications.  LACERATION REPAIR Date/Time: 05/02/2013 12:05 AM Performed by: Coral Ceo K Authorized by: Jillyn Ledger Consent given by: patient  Body area: lower extremity Location details: right lower leg Laceration length: 5 cm Foreign bodies: no foreign bodies Tendon involvement: none Nerve involvement: none Vascular damage: no Local anesthetic: lidocaine 2% with epinephrine Anesthetic total: 5 ml Patient sedated: no Preparation: Patient was prepped and draped in the usual sterile fashion. Irrigation solution: saline Irrigation method: jet lavage Amount of cleaning: extensive Debridement: none Degree of undermining: none Skin closure: 4-0 Prolene Subcutaneous closure: 4-0 Vicryl Number of sutures: 7 Technique: simple Approximation: close Approximation difficulty: simple Dressing: antibiotic ointment Patient tolerance: Patient tolerated the procedure well with no immediate complications.  LACERATION REPAIR Date/Time: 05/02/2013 12:05 AM Performed by: Coral Ceo K Authorized by: Coral Ceo K Body area: lower extremity Location details: right lower leg Laceration length: 4 cm Foreign bodies: no foreign bodies Tendon involvement: none Nerve involvement: none Vascular damage: no Anesthesia: local infiltration Local anesthetic: lidocaine 2% with epinephrine Anesthetic total: 4 ml Patient sedated: no Preparation: Patient was prepped and draped in the usual sterile fashion. Irrigation solution: saline Irrigation method: jet lavage Amount of cleaning: extensive Debridement: none Degree of undermining: none Skin closure: 4-0 Prolene Subcutaneous closure: 4-0 Vicryl Number of sutures: 7 Technique: simple Approximation: close Approximation difficulty: simple Dressing: antibiotic ointment Patient tolerance: Patient tolerated the procedure well with no immediate complications.   (including critical care time) Labs  Review Labs Reviewed - No data to display Imaging Review No results found.    DG Tibia/Fibula Right (Final result)  Result time: 05/02/13 23:18:46    Final result by Rad Results In Interface (05/02/13 23:18:46)    Narrative:   *RADIOLOGY REPORT*  Clinical Data: Fall and pain  RIGHT TIBIA AND FIBULA - 2 VIEW  Comparison: None.  Findings: Soft tissue swelling and surrounding bandage material is seen medially at the left upper tibia-fibula and circumferentially in the lower tibia-fibula. There is marked irregularity of the skin contour in the medial distal lower extremity consistent with skin laceration. There are a few locules of subcutaneous gas in the posterior soft tissues at the level of the distal third of the tibia-fibula.  In the posterior soft tissues near the junction of the mid to distal third of the tibia-fibula, at the level of a bandage, are multiple small irregularly shaped radiopaque densities in the soft tissue. The largest of these measures roughly 8 mm.  No acute fracture is identified.  IMPRESSION:  1. Multiple small foreign bodies in the posterior soft tissues at the level of the inferior bandage and skin laceration.  2. No acute fracture.   Original Report Authenticated By: Britta Mccreedy, M.D.             DG Ankle Complete Right (Final result)  Result time: 05/02/13 23:12:06    Final result by Rad Results In Interface (05/02/13 23:12:06)    Narrative:   *RADIOLOGY REPORT*  Clinical Data: Fall  RIGHT ANKLE - COMPLETE 3+ VIEW  Comparison: Right foot and right tibia-fibula radiographs  Findings: Soft tissue soft tissue swelling and laceration noted with surrounding bandage material. Near the junction of the middle and distal thirds of the tibia-fibula are multiple irregularly- shaped radiopaque foreign bodies in the dorsal soft tissues suspicious for foreign bodies. The largest of these is 6 mm. No acute fracture is  identified.  IMPRESSION: Findings suspicious for radiopaque foreign bodies in the dorsal soft tissues at the level of soft tissue laceration and surrounding bandage material.   Original Report Authenticated By: Britta Mccreedy, M.D.  DG Foot Complete Right (Final result)  Result time: 05/02/13 23:13:26    Final result by Rad Results In Interface (05/02/13 23:13:26)    Narrative:   *RADIOLOGY REPORT*  Clinical Data: Fall and pain  RIGHT FOOT COMPLETE - 3+ VIEW  Comparison: Right tibia-fibula radiographs  Findings: Normal bony mineralization and alignment. No acute or healing fracture. On the lateral view, some subcutaneous gas and soft tissue swelling is seen in the dorsal soft tissues at the level of the distal tibia fibula.  IMPRESSION: 1. Soft tissue swelling and locules of subcutaneous gas dorsally at the level of the distal tibia-fibula. 2. No acute bony abnormality of the foot.   Original Report Authenticated By: Britta Mccreedy, M.D.    Results for orders placed during the hospital encounter of 05/02/13  POCT PREGNANCY, URINE      Result Value Range   Preg Test, Ur NEGATIVE  NEGATIVE    MDM   1. Foreign body (FB) in soft tissue   2. Lacerations of multiple sites of leg, right, initial encounter     Cassandra Nicholson is a 27 y.o. female with a PMH of HTN, asthma, and headache who presents to the ED for evaluation of a fall.   Dilaudid ordered for pain.  Tetanus booster given.  Urine pregnancy ordered.     Patient was evaluated in the emergency room for multiple right lower leg lacerations.  Her lacerations were repaired, however, the foreign bodies were unable to be removed. She was put on Keflex for prophylaxis use.  She was also given crutches to stay off her right foot due to possible achilles tendon involvement.  Patient was able to actively dorsiflex and plantarflex her right foot.  She remained neurovascularly intact.  She was instructed to  return to the ED if she develops any signs of infection including increased redness/swelling, fever, severe pain, or other concerns.  Patient was informed of the foreign bodies that remain.  She was instructed to have the sutures removed in 7-10 days.  She was also given a prescription for norco for pain control. She was in agreement with discharge and plan.  She is obtaining a ride home.    Final impressions: 1. Multiple lacerations of right leg  2. Foreign body, right leg skin    Luiz Iron PA-C   This patient was discussed and evaluated by Dr. Arloa Koh, PA-C 05/03/13 1435

## 2013-05-03 MED ORDER — CEPHALEXIN 500 MG PO CAPS: 500.0000 mg | ORAL_CAPSULE | Freq: Two times a day (BID) | ORAL | Status: DC

## 2013-05-03 MED ORDER — HYDROCODONE-ACETAMINOPHEN 5-325 MG PO TABS: 1.0000 | ORAL_TABLET | ORAL | Status: DC | PRN

## 2013-05-03 NOTE — ED Notes (Signed)
Pt educated on signs and symptoms of infections and verbalized understanding. VS are within defined limits. NAD distress at this time.

## 2013-05-10 ENCOUNTER — Encounter (HOSPITAL_COMMUNITY): Payer: Self-pay | Admitting: Emergency Medicine

## 2013-05-10 ENCOUNTER — Emergency Department (HOSPITAL_COMMUNITY)
Admission: EM | Admit: 2013-05-10 | Discharge: 2013-05-10 | Disposition: A | Discharge status: Home / Self Care | Payer: Medicaid Other | Source: Home / Self Care

## 2013-05-10 DIAGNOSIS — Z4802 Encounter for removal of sutures: Secondary | ICD-10-CM

## 2013-05-10 NOTE — ED Notes (Signed)
Pt c/o right foot swelling x 7 days. Pt is here for suture removal and states her right foot has been swelling since the day after right leg injury. No meds taken for swelling. Jan Ranson, SMA

## 2013-05-10 NOTE — ED Provider Notes (Signed)
CSN: [id number]     Arrival date & time 05/10/13  0946 History   None    Chief Complaint  Patient presents with  . Suture / Staple Removal  . Foot Swelling   (Consider location/radiation/quality/duration/timing/severity/associated sxs/prior Treatment) Patient is a 27 y.o. female presenting with suture removal. The history is provided by the patient.  Suture / Staple Removal This is a new problem. The current episode started more than 1 week ago (mult lacs to right lower leg in ER on 9/4, here for suture removal.). The problem has been gradually improving.    Past Medical History  Diagnosis Date  . Headache(784.0)     migraines  . Seasonal allergies   . Hypertension     no current meds.  . Asthma     as a child  . Finger fracture, left 12/12/2012    left small P2 fx.   Past Surgical History  Procedure Laterality Date  . Tubal ligation  02/12/2008  . Open reduction internal fixation (orif) metacarpal Left 12/19/2012    Procedure: CLOSED REDUCTION PINNING LEFT SMALL P2 FRACTURE ;  Surgeon: Marlowe Shores, MD;  Location: [facility];  Service: Orthopedics;  Laterality: Left;   No family history on file. History  Substance Use Topics  . Smoking status: Current Every Day Smoker -- 0.50 packs/day for 12 years    Types: Cigarettes  . Smokeless tobacco: Never Used  . Alcohol Use: 0.0 oz/week     Comment: states socially.   OB History   Grav Para Term Preterm Abortions TAB SAB Ect Mult Living   4 3 2 1 1 1  0 0 0 3     Review of Systems  Constitutional: Negative.   Musculoskeletal: Positive for myalgias.  Skin: Positive for wound.    Allergies  Review of patient's allergies indicates no known allergies.  Home Medications   Current Outpatient Rx  Name  Route  Sig  Dispense  Refill  . cephALEXin (KEFLEX) 500 MG capsule   Oral   Take 1 capsule (500 mg total) by mouth 2 (two) times daily.   14 capsule   0   . HYDROcodone-acetaminophen (NORCO/VICODIN)  5-325 MG per tablet   Oral   Take 1 tablet by mouth every 4 (four) hours as needed for pain.   6 tablet   0    BP 126/81  Pulse 76  Temp(Src) 98.2 F (36.8 C) (Oral)  Resp 16  LMP 05/02/2013 Physical Exam  Nursing note and vitals reviewed. Constitutional: She is oriented to person, place, and time. She appears well-developed and well-nourished.  Musculoskeletal: She exhibits edema and tenderness.  Lacs well healed, no infection, sutures removed.  Neurological: She is alert and oriented to person, place, and time.  Skin: Skin is warm and dry. No erythema.    ED Course  Procedures (including critical care time) Labs Review Labs Reviewed - No data to display Imaging Review No results found.  MDM      Linna Hoff, MD 05/10/13 1038

## 2013-06-08 ENCOUNTER — Encounter (HOSPITAL_COMMUNITY): Payer: Self-pay | Admitting: Emergency Medicine

## 2013-06-08 ENCOUNTER — Emergency Department (INDEPENDENT_AMBULATORY_CARE_PROVIDER_SITE_OTHER)
Admission: EM | Admit: 2013-06-08 | Discharge: 2013-06-08 | Disposition: A | Discharge status: Home / Self Care | Payer: Medicaid Other | Source: Home / Self Care | Attending: Family Medicine | Admitting: Family Medicine

## 2013-06-08 DIAGNOSIS — Z5189 Encounter for other specified aftercare: Secondary | ICD-10-CM

## 2013-06-08 NOTE — ED Provider Notes (Signed)
CSN: [id number]     Arrival date & time 06/08/13  1612 History   First MD Initiated Contact with Patient 06/08/13 1644     Chief Complaint  Patient presents with  . Wound Check   (Consider location/radiation/quality/duration/timing/severity/associated sxs/prior Treatment) Patient is a 27 y.o. female presenting with wound check. The history is provided by the patient.  Wound Check This is a new problem. The current episode started yesterday. The problem has not changed since onset.Associated symptoms comments: C/o draining site from right leg wound and numbness to lat right ankle.    Past Medical History  Diagnosis Date  . Headache(784.0)     migraines  . Seasonal allergies   . Hypertension     no current meds.  . Asthma     as a child  . Finger fracture, left 12/12/2012    left small P2 fx.   Past Surgical History  Procedure Laterality Date  . Tubal ligation  02/12/2008  . Open reduction internal fixation (orif) metacarpal Left 12/19/2012    Procedure: CLOSED REDUCTION PINNING LEFT SMALL P2 FRACTURE ;  Surgeon: Marlowe Shores, MD;  Location: [facility];  Service: Orthopedics;  Laterality: Left;   No family history on file. History  Substance Use Topics  . Smoking status: Current Every Day Smoker -- 0.50 packs/day for 12 years    Types: Cigarettes  . Smokeless tobacco: Never Used  . Alcohol Use: 0.0 oz/week     Comment: states socially.   OB History   Grav Para Term Preterm Abortions TAB SAB Ect Mult Living   4 3 2 1 1 1  0 0 0 3     Review of Systems  Constitutional: Negative.   Cardiovascular: Negative for leg swelling.  Musculoskeletal: Negative for joint swelling.  Skin: Positive for wound.  Neurological: Positive for numbness.    Allergies  Review of patient's allergies indicates no known allergies.  Home Medications   Current Outpatient Rx  Name  Route  Sig  Dispense  Refill  . cephALEXin (KEFLEX) 500 MG capsule   Oral   Take 1  capsule (500 mg total) by mouth 2 (two) times daily.   14 capsule   0   . HYDROcodone-acetaminophen (NORCO/VICODIN) 5-325 MG per tablet   Oral   Take 1 tablet by mouth every 4 (four) hours as needed for pain.   6 tablet   0    BP 129/78  Pulse 84  Temp(Src) 99.1 F (37.3 C) (Oral)  Resp 17  SpO2 98% Physical Exam  Nursing note and vitals reviewed. Constitutional: She is oriented to person, place, and time. She appears well-developed and well-nourished.  Musculoskeletal: She exhibits tenderness.  Post leg wounds, no sign of infection, decreased lat ankle sensation, no sts., no warmth or erythema.  Neurological: She is alert and oriented to person, place, and time.  Skin: Skin is warm and dry. No erythema.    ED Course  Procedures (including critical care time) Labs Review Labs Reviewed - No data to display Imaging Review No results found.    MDM      Linna Hoff, MD 06/08/13 1700

## 2013-06-08 NOTE — ED Notes (Signed)
Pt is here for a f/u and have check her wound on right leg Believes it's not healing well and tingly/numnbess on foot Also states it has been draining  Alert w/no signs of acute distress.

## 2014-04-04 ENCOUNTER — Encounter (HOSPITAL_COMMUNITY): Payer: Self-pay | Admitting: Emergency Medicine

## 2014-04-04 ENCOUNTER — Other Ambulatory Visit (HOSPITAL_COMMUNITY)
Admission: RE | Admit: 2014-04-04 | Discharge: 2014-04-04 | Disposition: A | Discharge status: Home / Self Care | Payer: Medicaid Other | Source: Ambulatory Visit | Attending: Emergency Medicine | Admitting: Emergency Medicine

## 2014-04-04 ENCOUNTER — Emergency Department (INDEPENDENT_AMBULATORY_CARE_PROVIDER_SITE_OTHER)
Admission: EM | Admit: 2014-04-04 | Discharge: 2014-04-04 | Disposition: A | Discharge status: Home / Self Care | Payer: Medicaid Other | Source: Home / Self Care | Attending: Emergency Medicine | Admitting: Emergency Medicine

## 2014-04-04 DIAGNOSIS — Z113 Encounter for screening for infections with a predominantly sexual mode of transmission: Secondary | ICD-10-CM | POA: Diagnosis not present

## 2014-04-04 DIAGNOSIS — N760 Acute vaginitis: Secondary | ICD-10-CM | POA: Diagnosis present

## 2014-04-04 DIAGNOSIS — Z202 Contact with and (suspected) exposure to infections with a predominantly sexual mode of transmission: Secondary | ICD-10-CM | POA: Diagnosis not present

## 2014-04-04 LAB — POCT URINALYSIS DIP (DEVICE)
BILIRUBIN URINE: NEGATIVE
Glucose, UA: NEGATIVE mg/dL
HGB URINE DIPSTICK: NEGATIVE
KETONES UR: NEGATIVE mg/dL
Leukocytes, UA: NEGATIVE
Nitrite: NEGATIVE
PH: 6 (ref 5.0–8.0)
Protein, ur: NEGATIVE mg/dL
Specific Gravity, Urine: 1.02 (ref 1.005–1.030)
Urobilinogen, UA: 0.2 mg/dL (ref 0.0–1.0)

## 2014-04-04 LAB — POCT PREGNANCY, URINE: Preg Test, Ur: NEGATIVE

## 2014-04-04 MED ORDER — CEFTRIAXONE SODIUM 250 MG IJ SOLR
INTRAMUSCULAR | Status: AC
  Filled 2014-04-04: qty 250

## 2014-04-04 MED ORDER — AZITHROMYCIN 250 MG PO TABS
ORAL_TABLET | ORAL | Status: AC
  Filled 2014-04-04: qty 4

## 2014-04-04 MED ORDER — LIDOCAINE HCL (PF) 1 % IJ SOLN
INTRAMUSCULAR | Status: AC
  Filled 2014-04-04: qty 5

## 2014-04-04 MED ORDER — CEFTRIAXONE SODIUM 250 MG IJ SOLR
250.0000 mg | Freq: Once | INTRAMUSCULAR | Status: AC
  Administered 2014-04-04: 250 mg via INTRAMUSCULAR

## 2014-04-04 MED ORDER — AZITHROMYCIN 250 MG PO TABS
1000.0000 mg | ORAL_TABLET | Freq: Once | ORAL | Status: AC
  Administered 2014-04-04: 1000 mg via ORAL

## 2014-04-04 NOTE — ED Provider Notes (Signed)
CSN: [id number]     Arrival date & time 04/04/14  1957 History   First MD Initiated Contact with Patient 04/04/14 2038     Chief Complaint  Patient presents with  . Exposure to STD   (Consider location/radiation/quality/duration/timing/severity/associated sxs/prior Treatment) HPI She's a 28 year old woman here for STD exposure. She states her partner called her last night around midnight to tell her he was having burning and penile discharge. She states they have been having sex for quite sometime, last sex was on Monday. She denies any discharge, vaginal pain, abdominal pain, pelvic pain, fevers, chills.  Past Medical History  Diagnosis Date  . Headache(784.0)     migraines  . Seasonal allergies   . Hypertension     no current meds.  . Asthma     as a child  . Finger fracture, left 12/12/2012    left small P2 fx.   Past Surgical History  Procedure Laterality Date  . Tubal ligation  02/12/2008  . Open reduction internal fixation (orif) metacarpal Left 12/19/2012    Procedure: CLOSED REDUCTION PINNING LEFT SMALL P2 FRACTURE ;  Surgeon: Marlowe ShoresMatthew A Weingold, MD;  Location: [facility];  Service: Orthopedics;  Laterality: Left;   History reviewed. No pertinent family history. History  Substance Use Topics  . Smoking status: Current Every Day Smoker -- 0.50 packs/day for 12 years    Types: Cigarettes  . Smokeless tobacco: Never Used  . Alcohol Use: 0.0 oz/week     Comment: states socially.   OB History   Grav Para Term Preterm Abortions TAB SAB Ect Mult Living   4 3 2 1 1 1  0 0 0 3     Review of Systems  Constitutional: Negative.   Genitourinary: Negative.     Allergies  Review of patient's allergies indicates no known allergies.  Home Medications   Prior to Admission medications   Medication Sig Start Date End Date Taking? Authorizing Provider  cephALEXin (KEFLEX) 500 MG capsule Take 1 capsule (500 mg total) by mouth 2 (two) times daily. 05/03/13   Jillyn LedgerJessica K  Palmer, PA-C  HYDROcodone-acetaminophen (NORCO/VICODIN) 5-325 MG per tablet Take 1 tablet by mouth every 4 (four) hours as needed for pain. 05/03/13   Janene HarveyJessica K Palmer, PA-C   BP 167/99  Pulse 96  Temp(Src) 98.1 F (36.7 C) (Oral)  Resp 18  SpO2 100% Physical Exam  Constitutional: She appears well-developed and well-nourished. She appears distressed (irritated).  Genitourinary: Uterus normal. There is no rash or lesion on the right labia. There is no rash or lesion on the left labia. Cervix exhibits no motion tenderness and no discharge. Right adnexum displays no mass, no tenderness and no fullness. Left adnexum displays no mass, no tenderness and no fullness. No erythema or tenderness around the vagina. Vaginal discharge (thin white) found.    ED Course  Procedures (including critical care time) Labs Review Labs Reviewed  HIV ANTIBODY (ROUTINE TESTING)  RPR  POCT PREGNANCY, URINE  POCT URINALYSIS DIP (DEVICE)  CERVICOVAGINAL ANCILLARY ONLY  CERVICOVAGINAL ANCILLARY ONLY    Imaging Review No results found.   MDM   1. STD exposure    Urine pregnancy and urinalysis are negative. GC, Chlamydia, wet prep, HIV, RPR ordered. We'll treat presumptively for gonorrhea and Chlamydia with Rocephin and azithromycin. We will call her with any positive results.    Charm RingsErin J Jaycub Noorani, MD 04/04/14 2112

## 2014-04-04 NOTE — ED Notes (Addendum)
Tech attempted venipuncture twice L AC.  She called me to the room and asked me to assess 2nd site. I took over and  was able to get 1 full tube and 2 partial tubes.  Pt. c/o it hurting.  I pulled out and was going to try R arm and pt. refused to let me try again. I explained that we needed 3 tubes for HIV. She said you have enough blood. She got upset that I said HIV test that with the door open. I explained that only one pt.in the building with the door closed. He would not have heard what I said.

## 2014-04-04 NOTE — ED Notes (Addendum)
States she had unprotected sex w her usual partner on Monday , and had a call yesterday PM from same person to advise her he had been having STD symptoms since Tuesday . Here for check of poss exposure of STD and treatment. Denies symptoms at present

## 2014-04-04 NOTE — Discharge Instructions (Signed)
We treated you for gonorrhea and chlamydia. We also sent blood work and cultures to the lab.  If anything comes back positive, someone will call you.

## 2014-04-05 LAB — HIV ANTIBODY (ROUTINE TESTING W REFLEX): HIV 1&2 Ab, 4th Generation: NONREACTIVE

## 2014-04-05 LAB — RPR

## 2014-04-08 NOTE — ED Notes (Signed)
GC/Chlamydia neg., Affirm: Candida neg., Gardnerella and Trich pos., HIV/RPR non-reactive.  Message to Dr. Piedad ClimesHonig. Vassie MoselleYork, Albaro Deviney M 04/08/2014

## 2014-04-10 ENCOUNTER — Telehealth (HOSPITAL_COMMUNITY): Payer: Self-pay | Admitting: *Deleted

## 2014-04-10 MED ORDER — METRONIDAZOLE 500 MG PO TABS: 500.0000 mg | ORAL_TABLET | Freq: Two times a day (BID) | ORAL | Status: DC

## 2014-04-10 NOTE — ED Notes (Signed)
Dr. Piedad ClimesHonig e-prescribed Flagyl to pt.'s pharmacy.  I called pt. Pt. verified x 2 and given results.  Pt. told she needs Flagyl for bacterial vaginosis and Trich.   Pt. instructed to no alcohol while taking this medication, to notify her partner to be tested and treated with Flagyl, no sex until she has finished her medication and her partner has been treated and to practice safe sex. Suggested pt. get [facility], by appointment.  Pt. Told where to pick up her Rx. Pt. Voiced understanding. Vassie MoselleYork, Alton Bouknight M 04/10/2014

## 2014-04-12 ENCOUNTER — Emergency Department (HOSPITAL_COMMUNITY)
Admission: EM | Admit: 2014-04-12 | Discharge: 2014-04-12 | Disposition: A | Discharge status: Home / Self Care | Payer: Medicaid Other | Attending: Emergency Medicine | Admitting: Emergency Medicine

## 2014-04-12 ENCOUNTER — Encounter (HOSPITAL_COMMUNITY): Payer: Self-pay | Admitting: Emergency Medicine

## 2014-04-12 DIAGNOSIS — J45909 Unspecified asthma, uncomplicated: Secondary | ICD-10-CM | POA: Insufficient documentation

## 2014-04-12 DIAGNOSIS — I10 Essential (primary) hypertension: Secondary | ICD-10-CM | POA: Insufficient documentation

## 2014-04-12 DIAGNOSIS — A599 Trichomoniasis, unspecified: Secondary | ICD-10-CM | POA: Insufficient documentation

## 2014-04-12 DIAGNOSIS — Z79899 Other long term (current) drug therapy: Secondary | ICD-10-CM | POA: Insufficient documentation

## 2014-04-12 DIAGNOSIS — F17200 Nicotine dependence, unspecified, uncomplicated: Secondary | ICD-10-CM | POA: Diagnosis not present

## 2014-04-12 DIAGNOSIS — Z8781 Personal history of (healed) traumatic fracture: Secondary | ICD-10-CM | POA: Insufficient documentation

## 2014-04-12 DIAGNOSIS — Z8679 Personal history of other diseases of the circulatory system: Secondary | ICD-10-CM | POA: Insufficient documentation

## 2014-04-12 DIAGNOSIS — A64 Unspecified sexually transmitted disease: Secondary | ICD-10-CM | POA: Insufficient documentation

## 2014-04-12 MED ORDER — METRONIDAZOLE 500 MG PO TABS
500.0000 mg | ORAL_TABLET | Freq: Once | ORAL | Status: AC
  Administered 2014-04-12: 500 mg via ORAL
  Filled 2014-04-12: qty 1

## 2014-04-12 MED ORDER — METRONIDAZOLE 500 MG PO TABS: 500.0000 mg | ORAL_TABLET | Freq: Two times a day (BID) | ORAL | Status: DC

## 2014-04-12 NOTE — Discharge Instructions (Signed)

## 2014-04-12 NOTE — ED Notes (Signed)
She tells me a somewhat convoluted story which boils down to her being seen a week ago; at which time she was dx with B.V. And trich.  She states she did not receive the correct antibiotic for Trich.; but received "two shots" while she was here "for gonorrhea and chlamydia".  She is in no distress.

## 2014-04-12 NOTE — ED Provider Notes (Signed)
CSN: [id number]     Arrival date & time 04/12/14  1522 History   First MD Initiated Contact with Patient 04/12/14 1554     Chief Complaint  Patient presents with  . SEXUALLY TRANSMITTED DISEASE     (Consider location/radiation/quality/duration/timing/severity/associated sxs/prior Treatment) HPI 28 y.o. female presents with concern for exposure to Trichomonas, and a reportedly positive test for the same within the last week. She states that she went to [facility] where her prescription for Flagyl was apparently called them, however it was not there so she presented here for prescription. She denies any symptoms including fever, nausea, vomiting, vaginal discharge, bleeding, or pain anywhere. She denies any other associated symptoms. Her no exacerbating or alleviating factors. Timing constant.  She states that she is in an exposure to partner outside of her monogamous relationship who had Trichomonas. Past Medical History  Diagnosis Date  . Headache(784.0)     migraines  . Seasonal allergies   . Hypertension     no current meds.  . Asthma     as a child  . Finger fracture, left 12/12/2012    left small P2 fx.   Past Surgical History  Procedure Laterality Date  . Tubal ligation  02/12/2008  . Open reduction internal fixation (orif) metacarpal Left 12/19/2012    Procedure: CLOSED REDUCTION PINNING LEFT SMALL P2 FRACTURE ;  Surgeon: Marlowe ShoresMatthew A Weingold, MD;  Location: [facility];  Service: Orthopedics;  Laterality: Left;   No family history on file. History  Substance Use Topics  . Smoking status: Current Every Day Smoker -- 0.50 packs/day for 12 years    Types: Cigarettes  . Smokeless tobacco: Never Used  . Alcohol Use: 0.0 oz/week     Comment: states socially.   OB History   Grav Para Term Preterm Abortions TAB SAB Ect Mult Living   4 3 2 1 1 1  0 0 0 3     Review of Systems  Constitutional: Negative for fever and chills.  HENT: Negative for congestion, rhinorrhea  and sore throat.   Eyes: Negative for photophobia and visual disturbance.  Respiratory: Negative for cough and shortness of breath.   Cardiovascular: Negative for chest pain and leg swelling.  Gastrointestinal: Negative for nausea, vomiting, abdominal pain, diarrhea and constipation.  Endocrine: Negative for polyphagia and polyuria.  Genitourinary: Negative for dysuria, flank pain, vaginal bleeding, vaginal discharge and enuresis.  Musculoskeletal: Negative for back pain and gait problem.  Skin: Negative for color change and rash.  Neurological: Negative for dizziness, syncope, light-headedness and numbness.  Hematological: Negative for adenopathy. Does not bruise/bleed easily.  All other systems reviewed and are negative.     Allergies  Review of patient's allergies indicates no known allergies.  Home Medications   Prior to Admission medications   Medication Sig Start Date End Date Taking? Authorizing Provider  metroNIDAZOLE (FLAGYL) 500 MG tablet Take 1 tablet (500 mg total) by mouth 2 (two) times daily. 04/10/14  Yes Charm RingsErin J Honig, MD  metroNIDAZOLE (FLAGYL) 500 MG tablet Take 1 tablet (500 mg total) by mouth 2 (two) times daily. 04/12/14   Mirian MoMatthew Elsworth Ledin, MD   BP 148/73  Pulse 74  Temp(Src) 98.4 F (36.9 C) (Oral)  Resp 16  SpO2 100% Physical Exam  Vitals reviewed. Constitutional: She is oriented to person, place, and time. She appears well-developed and well-nourished.  HENT:  Head: Normocephalic and atraumatic.  Right Ear: External ear normal.  Left Ear: External ear normal.  Eyes: Conjunctivae and  EOM are normal. Pupils are equal, round, and reactive to light.  Neck: Normal range of motion. Neck supple.  Cardiovascular: Normal rate, regular rhythm, normal heart sounds and intact distal pulses.   Pulmonary/Chest: Effort normal and breath sounds normal.  Abdominal: Soft. Bowel sounds are normal. There is no tenderness.  Musculoskeletal: Normal range of motion.   Neurological: She is alert and oriented to person, place, and time.  Skin: Skin is warm and dry.    ED Course  Procedures (including critical care time) Labs Review Labs Reviewed - No data to display  Imaging Review No results found.   EKG Interpretation None      MDM   Final diagnoses:  Trichomonal infection    28 y.o. female  with pertinent PMH of prior STI, migraines presents with reported Trichomonas. She has not filled her antibiotics. She presents today because she was unable to do so. She has no symptoms, requests a new prescription. Physical exam as above, do not feel pelvic exam warranted given positive test in exposure. Discussed need for followup for further testing, however she states she had a negative HIV, and gonorrhea and Chlamydia test. Prescription written, and patient discharged home in stable condition.  Labs and imaging as above reviewed.   1. Trichomonal infection         Mirian Mo, MD 04/12/14 [phone]

## 2014-04-17 NOTE — ED Notes (Signed)
Patient called , her Rx to [facility], [address] has not been received . Called to relay Rx written by Berna Sparee Honig to pharmacist.

## 2014-05-28 ENCOUNTER — Inpatient Hospital Stay (HOSPITAL_COMMUNITY)
Admission: AD | Admit: 2014-05-28 | Discharge: 2014-05-28 | Disposition: A | Discharge status: Home / Self Care | Payer: Medicaid Other | Source: Ambulatory Visit | Attending: Obstetrics and Gynecology | Admitting: Obstetrics and Gynecology

## 2014-05-28 DIAGNOSIS — F17200 Nicotine dependence, unspecified, uncomplicated: Secondary | ICD-10-CM | POA: Insufficient documentation

## 2014-05-28 DIAGNOSIS — Z202 Contact with and (suspected) exposure to infections with a predominantly sexual mode of transmission: Secondary | ICD-10-CM | POA: Insufficient documentation

## 2014-05-28 LAB — WET PREP, GENITAL
TRICH WET PREP: NONE SEEN
YEAST WET PREP: NONE SEEN

## 2014-05-28 MED ORDER — CEFTRIAXONE SODIUM 250 MG IJ SOLR
250.0000 mg | Freq: Once | INTRAMUSCULAR | Status: AC
  Administered 2014-05-28: 250 mg via INTRAMUSCULAR
  Filled 2014-05-28: qty 250

## 2014-05-28 MED ORDER — METRONIDAZOLE 500 MG PO TABS
2000.0000 mg | ORAL_TABLET | Freq: Once | ORAL | Status: AC
  Administered 2014-05-28: 2000 mg via ORAL
  Filled 2014-05-28: qty 4

## 2014-05-28 MED ORDER — AZITHROMYCIN 250 MG PO TABS
1000.0000 mg | ORAL_TABLET | Freq: Once | ORAL | Status: AC
  Administered 2014-05-28: 1000 mg via ORAL
  Filled 2014-05-28: qty 4

## 2014-05-28 NOTE — MAU Provider Note (Signed)
History     CSN: [id number]  Arrival date and time: 05/28/14 0126   None     Chief Complaint  Patient presents with  . Exposure to STD   HPI Ms. Cassandra Nicholson is a 28 y.o. [phone] who presents to MAU today with complaint of exposure to trichomonas. She states that her "best friend" who she sometimes has sex with was in MAU yesterday and told that she had trichomonas. She also states that a few months ago she was treated for trichomonas and her "friend" was treated for chlamydia. She denies vaginal bleeding, discharge or abdominal pain. She states LMP of 05/21/14. She states that her "friend" also has a female partner. Her "friend" is female.    OB History   Grav Para Term Preterm Abortions TAB SAB Ect Mult Living   4 3 2 1 1 1  0 0 0 3      Past Medical History  Diagnosis Date  . Headache(784.0)     migraines  . Seasonal allergies   . Hypertension     no current meds.  . Asthma     as a child  . Finger fracture, left 12/12/2012    left small P2 fx.    Past Surgical History  Procedure Laterality Date  . Tubal ligation  02/12/2008  . Open reduction internal fixation (orif) metacarpal Left 12/19/2012    Procedure: CLOSED REDUCTION PINNING LEFT SMALL P2 FRACTURE ;  Surgeon: Marlowe ShoresMatthew A Weingold, MD;  Location: [facility];  Service: Orthopedics;  Laterality: Left;    No family history on file.  History  Substance Use Topics  . Smoking status: Current Every Day Smoker -- 0.50 packs/day for 12 years    Types: Cigarettes  . Smokeless tobacco: Never Used  . Alcohol Use: 0.0 oz/week     Comment: states socially.    Allergies: No Known Allergies  Prescriptions prior to admission  Medication Sig Dispense Refill  . metroNIDAZOLE (FLAGYL) 500 MG tablet Take 1 tablet (500 mg total) by mouth 2 (two) times daily.  14 tablet  0  . metroNIDAZOLE (FLAGYL) 500 MG tablet Take 1 tablet (500 mg total) by mouth 2 (two) times daily.  14 tablet  0    Review of Systems   Constitutional: Negative for fever and malaise/fatigue.  Gastrointestinal: Negative for nausea, vomiting and abdominal pain.  Genitourinary:       Neg - vaginal bleeding, discharge   Physical Exam   Blood pressure 166/97, pulse 97, temperature 98.4 F (36.9 C), temperature source Oral, resp. rate 16, height 5' 9.5" (1.765 m), weight 165 lb (74.844 kg), last menstrual period 05/26/2014, SpO2 100.00%.  Physical Exam  Constitutional: She is oriented to person, place, and time. She appears well-developed and well-nourished. No distress.  HENT:  Head: Normocephalic.  Cardiovascular: Normal rate.   Respiratory: Effort normal.  GI: Soft. She exhibits no distension and no mass. There is no tenderness. There is no rebound and no guarding.  Genitourinary: Uterus is not enlarged and not tender. Cervix exhibits no motion tenderness, no discharge and no friability. Right adnexum displays no mass and no tenderness. Left adnexum displays no mass and no tenderness. No bleeding around the vagina. Vaginal discharge (small amount of thick, white-yellow discharge noted) found.  Neurological: She is alert and oriented to person, place, and time.  Skin: Skin is warm and dry. No erythema.  Psychiatric: She has a normal mood and affect.   Results for orders placed during the  hospital encounter of 05/28/14 (from the past 24 hour(s))  WET PREP, GENITAL     Status: Abnormal   Collection Time    05/28/14  1:55 AM      Result Value Ref Range   Yeast Wet Prep HPF POC NONE SEEN  NONE SEEN   Trich, Wet Prep NONE SEEN  NONE SEEN   Clue Cells Wet Prep HPF POC FEW (*) NONE SEEN   WBC, Wet Prep HPF POC FEW (*) NONE SEEN     MAU Course  Procedures None  MDM Wet prep, GC/Chlamydia and HIV today Patient refuses HIV testing because of recent negative testing and need for blood draw Treated with Flagyl, Zithromax and Rocephin in MAU  Assessment and Plan  A: STD exposure  P: Discharge home Patient treated  in MAU today GC/Chlamydia culture pending Patient may return to MAU as needed or if her condition were to change or worsen  Marny Lowenstein, PA-C  05/28/2014, 1:50 AM

## 2014-05-28 NOTE — MAU Note (Signed)
Pt reports her current partner tested positive for trich yesterday. Pt states she is not having any symptoms.

## 2014-05-28 NOTE — Discharge Instructions (Signed)
Safe Sex Safe sex is about reducing the risk of giving or getting a sexually transmitted disease (STD). STDs are spread through sexual contact involving the genitals, mouth, or rectum. Some STDs can be cured and others cannot. Safe sex can also prevent unintended pregnancies.  WHAT ARE SOME SAFE SEX PRACTICES?  Limit your sexual activity to only one partner who is having sex with only you.  Talk to your partner about his or her past partners, past STDs, and drug use.  Use a condom every time you have sexual intercourse. This includes vaginal, oral, and anal sexual activity. Both females and males should wear condoms during oral sex. Only use latex or polyurethane condoms and water-based lubricants. Using petroleum-based lubricants or oils to lubricate a condom will weaken the condom and increase the chance that it will break. The condom should be in place from the beginning to the end of sexual activity. Wearing a condom reduces, but does not completely eliminate, your risk of getting or giving an STD. STDs can be spread by contact with infected body fluids and skin.  Get vaccinated for hepatitis B and HPV.  Avoid alcohol and recreational drugs, which can affect your judgment. You may forget to use a condom or participate in high-risk sex.  For females, avoid douching after sexual intercourse. Douching can spread an infection farther into the reproductive tract.  Check your body for signs of sores, blisters, rashes, or unusual discharge. See your health care provider if you notice any of these signs.  Avoid sexual contact if you have symptoms of an infection or are being treated for an STD. If you or your partner has herpes, avoid sexual contact when blisters are present. Use condoms at all other times.  If you are at risk of being infected with HIV, it is recommended that you take a prescription medicine daily to prevent HIV infection. This is called pre-exposure prophylaxis (PrEP). You are  considered at risk if:  You are a man who has sex with other men (MSM).  You are a heterosexual man or woman who is sexually active with more than one partner.  You take drugs by injection.  You are sexually active with a partner who has HIV.  Talk with your health care provider about whether you are at high risk of being infected with HIV. If you choose to begin PrEP, you should first be tested for HIV. You should then be tested every 3 months for as long as you are taking PrEP.  See your health care provider for regular screenings, exams, and tests for other STDs. Before having sex with a new partner, each of you should be screened for STDs and should talk about the results with each other. WHAT ARE THE BENEFITS OF SAFE SEX?   There is less chance of getting or giving an STD.  You can prevent unwanted or unintended pregnancies.  By discussing safe sex concerns with your partner, you may increase feelings of intimacy, comfort, trust, and honesty between the two of you. Document Released: 09/22/2004 Document Revised: 12/30/2013 Document Reviewed: 02/06/2012 [facility] Patient Information 2015 [place], [place]. This information is not intended to replace advice given to you by your health care provider. Make sure you discuss any questions you have with your health care provider. Sexually Transmitted Disease A sexually transmitted disease (STD) is a disease or infection often passed to another person during sex. However, STDs can be passed through nonsexual ways. An STD can be passed through:  [place] (  saliva).  Semen.  Blood.  Mucus from the vagina.  Pee (urine). HOW CAN I LESSEN MY CHANCES OF GETTING AN STD?  Use:  Latex condoms.  Water-soluble lubricants with condoms. Do not use petroleum jelly or oils.  Dental dams. These are small pieces of latex that are used as a barrier during oral sex.  Avoid having more than one sex partner.  Do not have sex with someone who has other  sex partners.  Do not have sex with anyone you do not know or who is at high risk for an STD.  Avoid risky sex that can break your skin.  Do not have sex if you have open sores on your mouth or skin.  Avoid drinking too much alcohol or taking illegal drugs. Alcohol and drugs can affect your good judgment.  Avoid oral and anal sex acts.  Get shots (vaccines) for HPV and hepatitis.  If you are at risk of being infected with HIV, it is advised that you take a certain medicine daily to prevent HIV infection. This is called pre-exposure prophylaxis (PrEP). You may be at risk if:  You are a man who has sex with other men (MSM).  You are attracted to the opposite sex (heterosexual) and are having sex with more than one partner.  You take drugs with a needle.  You have sex with someone who has HIV.  Talk with your doctor about if you are at high risk of being infected with HIV. If you begin to take PrEP, get tested for HIV first. Get tested every 3 months for as long as you are taking PrEP. WHAT SHOULD I DO IF I THINK I HAVE AN STD?  See your doctor.  Tell your sex partner(s) that you have an STD. They should be tested and treated.  Do not have sex until your doctor says it is okay. WHEN SHOULD I GET HELP? Get help right away if:  You have bad belly (abdominal) pain.  You are a man and have puffiness (swelling) or pain in your testicles.  You are a woman and have puffiness in your vagina. Document Released: 09/22/2004 Document Revised: 08/20/2013 Document Reviewed: 02/08/2013 [facility] Patient Information 2015 [place], [place]. This information is not intended to replace advice given to you by your health care provider. Make sure you discuss any questions you have with your health care provider.  [facility] Guide (Revised August 2014)   Chronic Pain Problems:    [place] [facility]:  [phone]           Patients need to be referred by  their primary care doctor/specialist  Insufficient Money for Medicine:           United Way: call "211"     MAP Program at [facility] Department - [place] [phone] or [place] [phone]            No Primary Care Doctor:  To locate a primary care doctor that accepts your insurance or provides certain services:           [place] Connect: [phone]           Physician Referral Service: [phone] ask for My [place]   If no insurance, you need to see if you qualify for [facility] orange card, call to set      up appointment for eligibility/enrollment at [phone] or [phone] or visit [facility]. of Health and [employer] ([address] [place] and [address]  Casimiro Needle -[place]) to meet with a [facility] enrollment specialist.  Agencies that provide inexpensive (sliding fee scale) medical care:        [facility] - Family Medicine at [facility] [phone]      [facility]  -  [facility] Adult [facility] [phone]      [facility] Internal Medicine - [phone]      [facility] Care & Wellness - [phone]      [facility] [phone]      [facility] [phone]   [facility] - [facility] Child Health @ [place] - [phone]     [phone]   [facility] - [facility] Health @ [place] - [phone]   [facility] Family Practice: [phone]    Women's Clinic: [phone]    Planned Parenthood: [phone]    [facility] [place] of the [place]    Medicaid-accepting [facility] Providers:           [facility] [phone] (No Family Planning accepted)          [address], [phone], Mon-Fri 9am-5pm          [facility] - [phone]   [address] [place], [address] [place], Mon-Thursday 8am-5pm, Fri 8am-noon   [employer] - [phone]          [address], Mon-Fri 7:30am-4:30pm          [facility] - [phone]          [address], [place] 8am-5pm          [facility] - [phone] [address], [address]          Only accepts [place] [employer] patients after they have their name applied to their card  Self Pay (no insurance) in [facility]:           Sickle Cell Patients:    [address] [place], [phone] [facility] Internal Medicine:   [address], [place] [phone]       [facility] and Wellness   [address] [place] [phone]  [facility] Family Practice:   [address], [phone]          [facility]           [address] [place], [phone] [facility]   [address] [place], [phone]           [facility] [place]           [address], [place] [phone]        [facility] - [address], [address]           [phone], Mon-Fri 9am-7pm, [place] 9am-1pm          [facility] - Family Medicine @ [facility]          [address] [place], [phone]          [facility] - [facility]           [address], [phone]  [facility] - [place] [facility] [place]   [address] [place] [phone]          [facility]           [address] [phone]  [facility] - [facility]    [address] [place], [phone] [facility] - [facility]   [address], [phone]  Dr. Julio Sicks           [address], [place], [phone]          [facility]           [address], [phone]          [facility]             [address] [phone]          [facility]           [address] [place], [phone], 1st & 3rd Saturday every month, 10am-1pm  OTHERS:  [employer]  (Immigration [employer] Only)  [phone] (Thursday only)  Strategies for finding a Primary Care Provider:  1) Find a Doctor and Pay Out of Pocket  Although you won't have to find out who is covered by your insurance plan, it is a good idea to ask around and get recommendations. You will then need to call the office and see if the doctor you have chosen will accept you as a new patient and what types of options they offer for patients who are self-pay. Some doctors offer discounts or will set up payment plans for their patients who do not have insurance, but you will need to ask so you aren't surprised when you get to your appointment.  2) Contact [place] [employer] - To see if you qualify for orange card access to healthcare safety net providers.  Call for appointment for eligibility/enrollment at [phone] or [phone]. (Uninsured, 0-200% FPL, qualifying info)  Applicants for [facility] are first required to see if they are eligible to enroll in the [facility] Marketplace before enrolling in [facility] (and get an exemption if they are not).  GCCN Criteria for acceptance is:    Proof of Engineering geologist exemption - form or documentation    Valid photo ID (driver's license, state identification card, passport, home country ID)    Proof of [facility] residency (e.g. drivers license, lease/landlord information, pay stubs with address, utility bill, bank statement, etc.)    Proof of income (1040, last year's tax return, W2, 4 current pay stubs, other income proof)    Proof of assets (current bank statement + 3 most recent, disability paperwork, life insurance info, tax value on autos, etc.)  3) Contact Your Local Health Department  Not all health departments have doctors that can see patients for sick visits, but many do, so it is worth a call to see if yours does. If you  don't know where your local health department is, you can check in your phone book. The CDC also has a tool to help you locate your state's health department, and many state websites also have listings of all of their local health departments.  4) Find a Walk-in Clinic  If your illness is not likely to be very severe or complicated, you may want to try a walk in clinic. These are  popping up all over the country in pharmacies, drugstores, and shopping centers. They're usually staffed by nurse practitioners or physician assistants that have been trained to treat common illnesses and complaints. They're usually fairly quick and inexpensive. However, if you have serious medical issues or chronic medical problems, these are probably not your best option   STD Testing:           [facility] [place], [place] [facility]           [address], [employer] [place], phone [phone] or [phone]           Monday - Friday, call for an appointment          [facility] Inpatient Department of [facility], [place] [facility]           [address], [place], phone [phone] or [phone]           Monday - Friday, call for an appointment Abuse/Neglect:           [facility] Child Abuse Hotline: 680-232-4732           [facility] Child Abuse Hotline: (819) 837-7503 (After Hours)  Emergency Shelter:  [facility] ([phone]  [facility] [place]- [phone]  [facility] [place] - [phone]  Youth Focus - Act Together - [phone] (ages 56-17)  Homeless Day Shelter @ [employer] - (860)203-1802   Mammograms - Free at [facility] [phone]  Maternity Homes:           Room at the [place] of the [facility]: [phone]   (Homeless mother with children)          [facility] Alert Services: [phone] (Mothers only)   Youth Focus: [phone] (Pregnant 60-49 years old)   Adopt a Mom -([phone]  [facility]    [facility] - Lanae Boast   [address] [place] [phone]          [facility] [place]           [address] [place]           [phone]          [place]           [address] [place], [place]           [phone]          [facility] Dept.           [address], [place]           [phone]          [facility] Mental Health           [phone]          [facility]           [phone]          [facility], [facility] in [facility]           [address]           [phone], Insurance          [place] Child Abuse Hotline           209-033-6633           (480)552-2584 (After Hours)  Behavioral Health Services /Substance Abuse  Resources:           Alcohol and Drug Services: (480) [phone]           [facility]: [phone])[phone]          [facility]: [phone]    Narcotics Helpline - (613)009-7529          [facility]: 312-219-3607           Residential & Outpatient Substance Abuse Program - [facility]: 563-169-0045   [employer]  Behavioral Health and Wellness Center - 334-220-2403 Psychological Services:          [facility]: [phone]    Therapeutic Alternatives: [phone]          [facility] Mental Health           [address] [place]           ACCESS LINE: [phone]     (24 Hour)   Mobile Crisis:    HELPLINES:  Financial risk analyst on Mental Illness - [place] [phone] [facility] on Mental Illness - [place] [phone]   Walk In [facility] - [address] - [place]  [phone]       [facility] - 9315063036 or [phone]  [facility] - [phone] S. [address] - [employer] [phone]  [facility]  System [phone]. [address], [place] [phone]   Dental Assistance:  If unable to pay or uninsured, contact: [facility]. to become qualified for the adult dental clinic. Patient must be enrolled in [facility] (uninsured, 0-200% FPL, qualifying info).  Enroll in [facility] first, then see Primary Care Physician assigned to you, the PCP makes a dental referral. [place] [facility] will receive referral and contacts patient for appointment.  Patients with Medicaid           [address], [phone]  [place] [facility] (Children up to 20 + Pregnant Women) - [phone]  [facility] Dentistry - [address] - Suite [phone]  If unable to pay, or uninsured: contact [facility] Department [phone] in [place] - (Children only + Pregnant Women), [phone] in [facility]- Children only) to become qualified for the adult dental clinic  Must see if eligible to enroll in [facility] Marketplace before enrolling into the [facility] (exemption required) [phone] for an appointment)  [email or web address];   [phone].  If not eligible for ACA, then go by Department of Health and Human Services to see if eligible for orange card.  [address], [place] and [phone]  Once you get an orange card, you will have a Primary Care home who will then refer you to dental if needed.        Other IT consultant:   [facility] - [phone] (ext [phone])   [address]  Dr. Lawrence Marseilles - [phone]   [address]    [place] - [phone]   [facility]           [address] [place], [place], [place], [address]           [phone], Ext. [phone]           2nd and 4th Thursday of the month at 6:30am (Simple  extractions only - no wisdom teeth or surgery) First come/First serve -First 10 clients served           [facility] [place], [place] and [place]  residents only)          [address], [place], [place], [phone]           [phone]                    [facility]           [phone]          [facility]          [phone]         [facility] - [facility]          [phone]   Transportation Options:  Ambulance - 911 - $[phone] per ride Family Member to accompany patient (if stable) Ginette Otto-  Transit Authority - [phone]  PART - [phone](336) [phone]  Taxi - [phone] - [place]  SCAT - [phone] (Application required)  [facility] - [phone](336) [phone]

## 2014-05-29 LAB — GC/CHLAMYDIA PROBE AMP
CT PROBE, AMP APTIMA: NEGATIVE
GC Probe RNA: NEGATIVE

## 2014-06-01 NOTE — MAU Provider Note (Signed)
Attestation of Attending Supervision of Advanced Practitioner (CNM/NP): Evaluation and management procedures were performed by the Advanced Practitioner under my supervision and collaboration.  I have reviewed the Advanced Practitioner's note and chart, and I agree with the management and plan.  Yazhini Mcaulay 06/01/2014 10:24 AM   

## 2014-06-30 ENCOUNTER — Encounter (HOSPITAL_COMMUNITY): Payer: Self-pay | Admitting: Emergency Medicine

## 2015-03-18 ENCOUNTER — Emergency Department (HOSPITAL_COMMUNITY)
Admission: EM | Admit: 2015-03-18 | Discharge: 2015-03-18 | Disposition: A | Discharge status: Home / Self Care | Payer: Medicaid Other | Attending: Emergency Medicine | Admitting: Emergency Medicine

## 2015-03-18 ENCOUNTER — Encounter (HOSPITAL_COMMUNITY): Payer: Self-pay | Admitting: Emergency Medicine

## 2015-03-18 DIAGNOSIS — Z720 Tobacco use: Secondary | ICD-10-CM | POA: Diagnosis not present

## 2015-03-18 DIAGNOSIS — J45909 Unspecified asthma, uncomplicated: Secondary | ICD-10-CM | POA: Insufficient documentation

## 2015-03-18 DIAGNOSIS — Z8781 Personal history of (healed) traumatic fracture: Secondary | ICD-10-CM | POA: Insufficient documentation

## 2015-03-18 DIAGNOSIS — R000 Tachycardia, unspecified: Secondary | ICD-10-CM | POA: Insufficient documentation

## 2015-03-18 DIAGNOSIS — G8929 Other chronic pain: Secondary | ICD-10-CM | POA: Diagnosis not present

## 2015-03-18 DIAGNOSIS — H9209 Otalgia, unspecified ear: Secondary | ICD-10-CM | POA: Insufficient documentation

## 2015-03-18 DIAGNOSIS — K004 Disturbances in tooth formation: Secondary | ICD-10-CM

## 2015-03-18 DIAGNOSIS — I10 Essential (primary) hypertension: Secondary | ICD-10-CM | POA: Diagnosis not present

## 2015-03-18 DIAGNOSIS — K089 Disorder of teeth and supporting structures, unspecified: Secondary | ICD-10-CM

## 2015-03-18 DIAGNOSIS — K088 Other specified disorders of teeth and supporting structures: Secondary | ICD-10-CM | POA: Insufficient documentation

## 2015-03-18 DIAGNOSIS — K032 Erosion of teeth: Secondary | ICD-10-CM

## 2015-03-18 DIAGNOSIS — K002 Abnormalities of size and form of teeth: Secondary | ICD-10-CM | POA: Insufficient documentation

## 2015-03-18 DIAGNOSIS — K029 Dental caries, unspecified: Secondary | ICD-10-CM | POA: Diagnosis not present

## 2015-03-18 MED ORDER — NAPROXEN 500 MG PO TABS: 500.0000 mg | ORAL_TABLET | Freq: Two times a day (BID) | ORAL | Status: DC | PRN

## 2015-03-18 MED ORDER — DOXYCYCLINE HYCLATE 100 MG PO CAPS: 100.0000 mg | ORAL_CAPSULE | Freq: Two times a day (BID) | ORAL | Status: DC

## 2015-03-18 MED ORDER — HYDROCODONE-ACETAMINOPHEN 5-325 MG PO TABS
1.0000 | ORAL_TABLET | Freq: Four times a day (QID) | ORAL | Status: DC | PRN
Start: 2015-03-18 — End: 2016-06-29

## 2015-03-18 MED ORDER — HYDROCODONE-ACETAMINOPHEN 5-325 MG PO TABS
1.0000 | ORAL_TABLET | Freq: Once | ORAL | Status: AC
  Administered 2015-03-18: 1 via ORAL
  Filled 2015-03-18: qty 1

## 2015-03-18 NOTE — ED Provider Notes (Signed)
CSN: [id number]     Arrival date & time 03/18/15  0529 History   First MD Initiated Contact with Patient 03/18/15 [phone]     Chief Complaint  Patient presents with  . Dental Pain  . Otalgia     (Consider location/radiation/quality/duration/timing/severity/associated sxs/prior Treatment) HPI Comments: Cassandra Aceiffany M Nicholson is a 29 y.o. female with a PMHx of HTN, migraines, seasonal allergies, and asthma, who presents to the ED with complaints of left upper molar pain 3 weeks. She was seen by her dentist Percell Bostonammy Artis 2 months ago and had a cavity filled, but she still has a fracture line on the enamel and was told she eventually needed the tooth to be pulled. She reports that the pain is 10/10 constant throbbing, radiating to the left ear, worse with air exposure, and improved with Norco, heat, Advil, and NyQuil. She is a smoker. She denies any gum swelling or drainage, facial or neck swelling, drooling, trismus, URI symptoms, difficulty swallowing, ear drainage, fevers, chills, chest pain, shortness breath, abdominal pain, nausea, vomiting, dysuria, hematuria, numbness, tingling, or weakness.   Of note she has a history of hypertension but she is no longer taking her metoprolol because she stopped seeing a primary medical doctor several months ago. She denies any headache or vision changes today, denies any symptoms of high blood pressure. She has no PCP.   Patient is a 29 y.o. female presenting with tooth pain and ear pain. The history is provided by the patient. No language interpreter was used.  Dental Pain Location:  Upper Upper teeth location:  14/LU 1st molar Quality:  Throbbing Severity:  Severe Onset quality:  Gradual Duration:  3 weeks Timing:  Constant Progression:  Unchanged Chronicity:  Recurrent Context: enamel fracture   Previous work-up:  Filled cavity and dental exam Relieved by:  NSAIDs, heat and acetaminophen (and norco) Exacerbated by: air. Ineffective treatments:  None  tried Associated symptoms: no difficulty swallowing, no drooling, no facial swelling, no fever, no gum swelling, no headaches, no neck pain, no neck swelling, no oral bleeding and no trismus   Risk factors: smoking   Otalgia Associated symptoms: no abdominal pain, no ear discharge, no fever, no headaches, no neck pain, no rhinorrhea, no sore throat and no vomiting     Past Medical History  Diagnosis Date  . Headache(784.0)     migraines  . Seasonal allergies   . Hypertension     no current meds.  . Asthma     as a child  . Finger fracture, left 12/12/2012    left small P2 fx.   Past Surgical History  Procedure Laterality Date  . Tubal ligation  02/12/2008  . Open reduction internal fixation (orif) metacarpal Left 12/19/2012    Procedure: CLOSED REDUCTION PINNING LEFT SMALL P2 FRACTURE ;  Surgeon: Marlowe ShoresMatthew A Weingold, MD;  Location: [facility];  Service: Orthopedics;  Laterality: Left;   No family history on file. History  Substance Use Topics  . Smoking status: Current Every Day Smoker -- 0.50 packs/day for 12 years    Types: Cigarettes  . Smokeless tobacco: Never Used  . Alcohol Use: 0.0 oz/week     Comment: states socially.   OB History    Gravida Para Term Preterm AB TAB SAB Ectopic Multiple Living   4 3 2 1 1 1  0 0 0 3     Review of Systems  Constitutional: Negative for fever and chills.  HENT: Positive for dental problem and ear pain.  Negative for drooling, ear discharge, facial swelling, rhinorrhea and sore throat.   Eyes: Negative for visual disturbance.  Respiratory: Negative for shortness of breath.   Cardiovascular: Negative for chest pain.  Gastrointestinal: Negative for nausea, vomiting and abdominal pain.  Genitourinary: Negative for dysuria and hematuria.  Musculoskeletal: Negative for myalgias, arthralgias and neck pain.  Skin: Negative for color change.  Allergic/Immunologic: Negative for immunocompromised state.  Neurological: Negative for  weakness, numbness and headaches.  Psychiatric/Behavioral: Negative for confusion.   10 Systems reviewed and are negative for acute change except as noted in the HPI.    Allergies  Review of patient's allergies indicates no known allergies.  Home Medications   Prior to Admission medications   Medication Sig Start Date End Date Taking? Authorizing Provider  HYDROcodone-acetaminophen (NORCO/VICODIN) 5-325 MG per tablet Take 2 tablets by mouth every 6 (six) hours as needed for moderate pain.   Yes Historical Provider, MD   BP 162/103 mmHg  Pulse 101  Temp(Src) 98.4 F (36.9 C) (Oral)  Resp 17  SpO2 100%  LMP 02/26/2015 Physical Exam  Constitutional: She is oriented to person, place, and time. She appears well-developed and well-nourished.  Non-toxic appearance. No distress.  Afebrile, nontoxic, NAD. Mildly hypertensive  HENT:  Head: Normocephalic and atraumatic.  Right Ear: Hearing, tympanic membrane, external ear and ear canal normal.  Left Ear: Hearing, tympanic membrane, external ear and ear canal normal.  Nose: Nose normal.  Mouth/Throat: Uvula is midline, oropharynx is clear and moist and mucous membranes are normal. No trismus in the jaw. Abnormal dentition. Dental caries present. No dental abscesses or uvula swelling.  Ears are clear bilaterally. Nose clear. Oropharynx clear and moist, without uvular swelling or deviation, no trismus or drooling, no tonsillar swelling or erythema, no exudates.  L upper molar #14 with small fracture line to enamel, filling in place. No surrounding gingival erythema or swelling  Eyes: Conjunctivae and EOM are normal. Right eye exhibits no discharge. Left eye exhibits no discharge.  Neck: Normal range of motion. Neck supple.  Cardiovascular: Normal rate.   Initially tachycardic during triage, but HR 92-96 during exam  Pulmonary/Chest: Effort normal. No respiratory distress.  Abdominal: Normal appearance. She exhibits no distension.   Musculoskeletal: Normal range of motion.  Lymphadenopathy:       Head (left side): Submandibular adenopathy present.  Reactive submandibular LAD to L side, mildly TTP  Neurological: She is alert and oriented to person, place, and time. She has normal strength. No sensory deficit.  Skin: Skin is warm, dry and intact. No rash noted.  Psychiatric: She has a normal mood and affect. Her behavior is normal.  Nursing note and vitals reviewed.   ED Course  Procedures (including critical care time) Labs Review Labs Reviewed - No data to display  Imaging Review No results found.   EKG Interpretation None      MDM   Final diagnoses:  Chronic dental pain  Enamel defect of tooth  Tobacco use  HTN (hypertension), benign    29 y.o. female here with L upper molar #14 pain, has small fracture to enamel, had it filled 2 months ago. Some reactive LAD therefore possible dental infection starting, with patient afebrile, non toxic appearing and swallowing secretions well. Discussed that pt needs to f/up with her dentist, but I also gave patient referral to dentists and stressed the importance of dental follow up for ultimate management of dental pain. Tobacco cessation stressed. I have also discussed reasons to return immediately to  the ER.  Patient expresses understanding and agrees with plan.  I will also give doxycycline and pain control.  Of note, pt with HTN, used to be on metoprolol but stopped when she stopped going to a regular doctor. Will refer to [facility]. Pt asymptomatic for HTN, doubt need for further work up.   BP 162/103 mmHg  Pulse 101  Temp(Src) 98.4 F (36.9 C) (Oral)  Resp 17  SpO2 100%  LMP 02/26/2015  Meds ordered this encounter  Medications  . HYDROcodone-acetaminophen (NORCO/VICODIN) 5-325 MG per tablet 1 tablet    Sig:   . HYDROcodone-acetaminophen (NORCO) 5-325 MG per tablet    Sig: Take 1 tablet by mouth every 6 (six) hours as needed for severe pain.    Dispense:  6  tablet    Refill:  0    Order Specific Question:  Supervising Provider    Answer:  MILLER, BRIAN [[id number]]  . naproxen (NAPROSYN) 500 MG tablet    Sig: Take 1 tablet (500 mg total) by mouth 2 (two) times daily as needed for mild pain, moderate pain or headache (TAKE WITH MEALS.).    Dispense:  20 tablet    Refill:  0    Order Specific Question:  Supervising Provider    Answer:  MILLER, BRIAN [[id number]]  . doxycycline (VIBRAMYCIN) 100 MG capsule    Sig: Take 1 capsule (100 mg total) by mouth 2 (two) times daily. One po bid x 7 days    Dispense:  14 capsule    Refill:  0    Order Specific Question:  Supervising Provider    Answer:  Eber Hong [[id number]]     Kutler Vanvranken Camprubi-Soms, PA-C 03/18/15 1610  Marisa Severin, MD 03/18/15 0700

## 2015-03-18 NOTE — Discharge Instructions (Signed)
Apply warm compresses to jaw throughout the day. Take antibiotic until finished and avoid direct sunlight exposure while taking the antibiotic. Take naprosyn and norco as directed, as needed for pain but do not drive or operate machinery with pain medication use. Followup with a dentist is very important for ongoing evaluation and management of recurrent dental pain. Call your dentist today to make an appointment, or call the dentist above to make an appointment. Use the list below if you can't get appointments with either one of those options. STOP SMOKING! Your blood pressure is also high, you need to follow up with [facility] for ongoing management and to establish primary care. Return to emergency department for emergent changing or worsening symptoms.   Dental Pain Toothache is pain in or around a tooth. It may get worse with chewing or with cold or heat.  HOME CARE  Your dentist may use a numbing medicine during treatment. If so, you may need to avoid eating until the medicine wears off. Ask your dentist about this.  Only take medicine as told by your dentist or doctor.  Avoid chewing food near the painful tooth until after all treatment is done. Ask your dentist about this. GET HELP RIGHT AWAY IF:   The problem gets worse or new problems appear.  You have a fever.  There is redness and puffiness (swelling) of the face, jaw, or neck.  You cannot open your mouth.  There is pain in the jaw.  There is very bad pain that is not helped by medicine. MAKE SURE YOU:   Understand these instructions.  Will watch your condition.  Will get help right away if you are not doing well or get worse. Document Released: 02/01/2008 Document Revised: 11/07/2011 Document Reviewed: 02/01/2008 [facility] Patient Information 2015 [place], [place]. This information is not intended to replace advice given to you by your health care provider. Make sure you discuss any questions you have with  your health care provider.  Hypertension Hypertension is another name for high blood pressure. High blood pressure forces your heart to work harder to pump blood. A blood pressure reading has two numbers, which includes a higher number over a lower number (example: 110/72). HOME CARE   Have your blood pressure rechecked by your doctor.  Only take medicine as told by your doctor. Follow the directions carefully. The medicine does not work as well if you skip doses. Skipping doses also puts you at risk for problems.  Do not smoke.  Monitor your blood pressure at home as told by your doctor. GET HELP IF:  You think you are having a reaction to the medicine you are taking.  You have repeat headaches or feel dizzy.  You have puffiness (swelling) in your ankles.  You have trouble with your vision. GET HELP RIGHT AWAY IF:   You get a very bad headache and are confused.  You feel weak, numb, or faint.  You get chest or belly (abdominal) pain.  You throw up (vomit).  You cannot breathe very well. MAKE SURE YOU:   Understand these instructions.  Will watch your condition.  Will get help right away if you are not doing well or get worse. Document Released: 02/01/2008 Document Revised: 08/20/2013 Document Reviewed: 06/07/2013 [facility] Patient Information 2015 [place], [place]. This information is not intended to replace advice given to you by your health care provider. Make sure you discuss any questions you have with your health care provider.  Smoking Cessation, Tips  for Success If you are ready to quit smoking, congratulations! You have chosen to help yourself be healthier. Cigarettes bring nicotine, tar, carbon monoxide, and other irritants into your body. Your lungs, heart, and blood vessels will be able to work better without these poisons. There are many different ways to quit smoking. Nicotine gum, nicotine patches, a nicotine inhaler, or nicotine nasal spray can help with  physical craving. Hypnosis, support groups, and medicines help break the habit of smoking. WHAT THINGS CAN I DO TO MAKE QUITTING EASIER?  Here are some tips to help you quit for good:  Pick a date when you will quit smoking completely. Tell all of your friends and family about your plan to quit on that date.  Do not try to slowly cut down on the number of cigarettes you are smoking. Pick a quit date and quit smoking completely starting on that day.  Throw away all cigarettes.   Clean and remove all ashtrays from your home, work, and car.  On a card, write down your reasons for quitting. Carry the card with you and read it when you get the urge to smoke.  Cleanse your body of nicotine. Drink enough water and fluids to keep your urine clear or pale yellow. Do this after quitting to flush the nicotine from your body.  Learn to predict your moods. Do not let a bad situation be your excuse to have a cigarette. Some situations in your life might tempt you into wanting a cigarette.  Never have "just one" cigarette. It leads to wanting another and another. Remind yourself of your decision to quit.  Change habits associated with smoking. If you smoked while driving or when feeling stressed, try other activities to replace smoking. Stand up when drinking your coffee. Brush your teeth after eating. Sit in a different chair when you read the paper. Avoid alcohol while trying to quit, and try to drink fewer caffeinated beverages. Alcohol and caffeine may urge you to smoke.  Avoid foods and drinks that can trigger a desire to smoke, such as sugary or spicy foods and alcohol.  Ask people who smoke not to smoke around you.  Have something planned to do right after eating or having a cup of coffee. For example, plan to take a walk or exercise.  Try a relaxation exercise to calm you down and decrease your stress. Remember, you may be tense and nervous for the first 2 weeks after you quit, but this will  pass.  Find new activities to keep your hands busy. Play with a pen, coin, or rubber band. Doodle or draw things on paper.  Brush your teeth right after eating. This will help cut down on the craving for the taste of tobacco after meals. You can also try mouthwash.   Use oral substitutes in place of cigarettes. Try using lemon drops, carrots, cinnamon sticks, or chewing gum. Keep them handy so they are available when you have the urge to smoke.  When you have the urge to smoke, try deep breathing.  Designate your home as a nonsmoking area.  If you are a heavy smoker, ask your health care provider about a prescription for nicotine chewing gum. It can ease your withdrawal from nicotine.  Reward yourself. Set aside the cigarette money you save and buy yourself something nice.  Look for support from others. Join a support group or smoking cessation program. Ask someone at home or at work to help you with your plan to quit  smoking.  Always ask yourself, "Do I need this cigarette or is this just a reflex?" Tell yourself, "Today, I choose not to smoke," or "I do not want to smoke." You are reminding yourself of your decision to quit.  Do not replace cigarette smoking with electronic cigarettes (commonly called e-cigarettes). The safety of e-cigarettes is unknown, and some may contain harmful chemicals.  If you relapse, do not give up! Plan ahead and think about what you will do the next time you get the urge to smoke. HOW WILL I FEEL WHEN I QUIT SMOKING? You may have symptoms of withdrawal because your body is used to nicotine (the addictive substance in cigarettes). You may crave cigarettes, be irritable, feel very hungry, cough often, get headaches, or have difficulty concentrating. The withdrawal symptoms are only temporary. They are strongest when you first quit but will go away within 10-14 days. When withdrawal symptoms occur, stay in control. Think about your reasons for quitting. Remind  yourself that these are signs that your body is healing and getting used to being without cigarettes. Remember that withdrawal symptoms are easier to treat than the major diseases that smoking can cause.  Even after the withdrawal is over, expect periodic urges to smoke. However, these cravings are generally short lived and will go away whether you smoke or not. Do not smoke! WHAT RESOURCES ARE AVAILABLE TO HELP ME QUIT SMOKING? Your health care provider can direct you to community resources or hospitals for support, which may include:  Group support.  Education.  Hypnosis.  Therapy. Document Released: 05/13/2004 Document Revised: 12/30/2013 Document Reviewed: 01/31/2013 [facility] Patient Information 2015 [place]. This information is not intended to replace advice given to you by your health care provider. Make sure you discuss any questions you have with your health care provider.   Emergency Department Resource Guide 1) Find a Doctor and Pay Out of Pocket Although you won't have to find out who is covered by your insurance plan, it is a good idea to ask around and get recommendations. You will then need to call the office and see if the doctor you have chosen will accept you as a new patient and what types of options they offer for patients who are self-pay. Some doctors offer discounts or will set up payment plans for their patients who do not have insurance, but you will need to ask so you aren't surprised when you get to your appointment.  2) Contact Your Local Health Department Not all health departments have doctors that can see patients for sick visits, but many do, so it is worth a call to see if yours does. If you don't know where your local health department is, you can check in your phone book. The CDC also has a tool to help you locate your state's health department, and many state websites also have listings of all of their local health departments.  3) Find a Walk-in  Clinic If your illness is not likely to be very severe or complicated, you may want to try a walk in clinic. These are popping up all over the country in pharmacies, drugstores, and shopping centers. They're usually staffed by nurse practitioners or physician assistants that have been trained to treat common illnesses and complaints. They're usually fairly quick and inexpensive. However, if you have serious medical issues or chronic medical problems, these are probably not your best option.  No Primary Care Doctor: - Call Health Connect at  [phone] - they can help  you locate a primary care doctor that  accepts your insurance, provides certain services, etc. - Physician Referral Service- [phone]  Chronic Pain Problems: Organization         Address  Phone   Notes  [facility]  [phone] Patients need to be referred by their primary care doctor.   Medication Assistance: Organization         Address  Phone   Notes  [facility] Medication [facility] [address] [place], [place] [address] [phone] --Must be a resident of [facility] -- Must have NO insurance coverage whatsoever (no Medicaid/ Medicare, etc.) -- The pt. MUST have a primary care doctor that directs their care regularly and follows them in the community   [facility]  [phone]   [employer]  [phone]     Dental Care: Organization         Address  Phone  Notes  [facility] Department of [facility] [address] [place], [place] [phone] Accepts children up to age 11 who are enrolled in [place] or [place] Health Choice; pregnant women with a Medicaid card; and children who have applied for Medicaid or [place] Health Choice, but were declined, whose parents can pay a reduced fee at time of service.  [facility] Department of [facility]  [address], [place] [phone] Accepts children up to age 32  who are enrolled in [place] or [place] Health Choice; pregnant women with a Medicaid card; and children who have applied for Medicaid or [place] Health Choice, but were declined, whose parents can pay a reduced fee at time of service.  [facility]  [address] [place], [place] [phone] Patients are seen by appointment only. Walk-ins are not accepted. [facility] will see patients 6 years of age and older. Monday - Tuesday (8am-5pm) Most Wednesdays (8:30-5pm) $30 per visit, cash only  [facility]  [address], [facility] [phone] Patients are seen by appointment only. Walk-ins are not accepted. [facility] will see patients 62 years of age and older. One Wednesday Evening (Monthly: Volunteer Based).  $30 per visit, cash only  [employer]  [phone] for adults; Children under age 102, call Graduate Pediatric Dentistry at [phone]. Children aged 92-14, please call [phone] to request a pediatric application.  Dental services are provided in all areas of dental care including fillings, crowns and bridges, complete and partial dentures, implants, gum treatment, root canals, and extractions. Preventive care is also provided. Treatment is provided to both adults and children. Patients are selected via a lottery and there is often a waiting list.   [facility] [address], [place]  [phone] [email or web address]   [facility] [address] [place], [place] [phone], Ext. [phone] Second and Fourth Thursday of each month, opens at 6:30 AM; Clinic ends at 9 AM.  Patients are seen on a first-come first-served basis, and a limited number are seen during each clinic.   [facility]  [address] [place], [place] [phone]   Eligibility Requirements You must have lived in [place], [place], or [place] counties for at least the last three months.    You cannot be eligible for state or federal sponsored [place], including [employer], [place], or [employer].   You generally cannot be  eligible for healthcare insurance through your employer.    How to apply: Eligibility screenings are held every Tuesday and Wednesday afternoon from 1:00 pm until 4:00 pm. You do not need an appointment for the interview!  [facility] [address] [place], [place] [phone]   [facility] Health Department  [phone]   [facility] Health Department  [phone]   [facility]  [phone]

## 2015-03-18 NOTE — ED Notes (Signed)
Pt. reports worsening left upper molar pain radiating to left ear onset 2 weeks ago .

## 2015-03-18 NOTE — ED Notes (Addendum)
Seen by DDS Holly BodilyArtis in May to have tooth filled.    Patient is aware of her BP being elevated.  Family at bedside.  Patient states she had been taking Metoprolol but stopped taking because "I did not want to get addicted".

## 2015-05-04 ENCOUNTER — Emergency Department (HOSPITAL_COMMUNITY)
Admission: EM | Admit: 2015-05-04 | Discharge: 2015-05-04 | Disposition: A | Discharge status: Home / Self Care | Payer: Medicaid Other | Attending: Emergency Medicine | Admitting: Emergency Medicine

## 2015-05-04 ENCOUNTER — Encounter (HOSPITAL_COMMUNITY): Payer: Self-pay | Admitting: Emergency Medicine

## 2015-05-04 DIAGNOSIS — Y838 Other surgical procedures as the cause of abnormal reaction of the patient, or of later complication, without mention of misadventure at the time of the procedure: Secondary | ICD-10-CM | POA: Insufficient documentation

## 2015-05-04 DIAGNOSIS — Z720 Tobacco use: Secondary | ICD-10-CM | POA: Insufficient documentation

## 2015-05-04 DIAGNOSIS — Z8781 Personal history of (healed) traumatic fracture: Secondary | ICD-10-CM | POA: Diagnosis not present

## 2015-05-04 DIAGNOSIS — J45909 Unspecified asthma, uncomplicated: Secondary | ICD-10-CM | POA: Diagnosis not present

## 2015-05-04 DIAGNOSIS — T85848A Pain due to other internal prosthetic devices, implants and grafts, initial encounter: Secondary | ICD-10-CM

## 2015-05-04 DIAGNOSIS — I10 Essential (primary) hypertension: Secondary | ICD-10-CM | POA: Diagnosis not present

## 2015-05-04 DIAGNOSIS — T8584XA Pain due to internal prosthetic devices, implants and grafts, not elsewhere classified, initial encounter: Secondary | ICD-10-CM | POA: Insufficient documentation

## 2015-05-04 DIAGNOSIS — K088 Other specified disorders of teeth and supporting structures: Secondary | ICD-10-CM | POA: Diagnosis present

## 2015-05-04 MED ORDER — KETOROLAC TROMETHAMINE 60 MG/2ML IM SOLN
60.0000 mg | Freq: Once | INTRAMUSCULAR | Status: AC
  Administered 2015-05-04: 60 mg via INTRAMUSCULAR
  Filled 2015-05-04: qty 2

## 2015-05-04 MED ORDER — PENICILLIN V POTASSIUM 500 MG PO TABS: 500.0000 mg | ORAL_TABLET | Freq: Three times a day (TID) | ORAL | Status: DC

## 2015-05-04 NOTE — ED Notes (Signed)
Pt c/o right upper molar dental pain onset this morning, states her dentist is closed. Pt self-treated with ibuprofen without success.

## 2015-05-04 NOTE — ED Provider Notes (Signed)
  Face-to-face evaluation   History: She complains of pain and her left upper molar, since a dental filling was applied several months ago. She was seen at [facility] Emergency department July 2016. At that time treated with narcotic and antibiotic. She states she tried to see her dentist today, but the dentist was closed. She states that she has tried over-the-counter medicine, without relief. She denies dental trauma, fever, chills, nausea, vomiting. She states that she came to [facility], because it was "fast".  Patient was initially seen by the PA, and after that evaluation. The patient requested that I see her.  Physical exam: Alert, agitated, defensive. She is alleging "racism". Oral exam: No trismus. Dentition appears normal. Affected tooth causing her pain (left upper molar) appears normal without associated swelling, deformity, fracture or cavity.  Assessment; dental pain without evidence for deep infection, abscess, or traumatic injury. Patient will be given a prescription of metabolic, for possible occult infection. Her pain at this point can be treated with anti-inflammatory's. There is no indication for additional pain medicine at this time.  Medical screening examination/treatment/procedure(s) were conducted as a shared visit with non-physician practitioner(s) and myself.  I personally evaluated the patient during the encounter   10:38-  After I discharged the patient. She asked the nurse for a injection of Toradol which had previously offered, but she had previously declined. I ordered the Toradol now.  Mancel Bale, MD 05/04/15 [phone]

## 2015-05-04 NOTE — ED Provider Notes (Signed)
CSN: [id number]     Arrival date & time 05/04/15  0950 History   First MD Initiated Contact with Patient 05/04/15 1000     Chief Complaint  Patient presents with  . Dental Pain     (Consider location/radiation/quality/duration/timing/severity/associated sxs/prior Treatment) HPI Cassandra Nicholson is a 29 y.o. female  Who presents to emergency department complaining of dental pain. Patient states that she has had pain In that same tooth in the past and was seen by a dentist several weeks ago. She states that they put a filling in the tooth, and states that since then she has had intermittent pain in that tooth. She states pain got worse this morning. She states since it is a holiday, her dentist office is closed that she is unable to go see them. Patient is requesting antibiotics and pain medications. She states she taken over-the-counter medicine which did not help her pain.patient denies fever, chills, facial swelling.   Past Medical History  Diagnosis Date  . Headache(784.0)     migraines  . Seasonal allergies   . Hypertension     no current meds.  . Asthma     as a child  . Finger fracture, left 12/12/2012    left small P2 fx.   Past Surgical History  Procedure Laterality Date  . Tubal ligation  02/12/2008  . Open reduction internal fixation (orif) metacarpal Left 12/19/2012    Procedure: CLOSED REDUCTION PINNING LEFT SMALL P2 FRACTURE ;  Surgeon: Marlowe Shores, MD;  Location: [facility];  Service: Orthopedics;  Laterality: Left;   History reviewed. No pertinent family history. Social History  Substance Use Topics  . Smoking status: Current Every Day Smoker -- 0.50 packs/day for 12 years    Types: Cigarettes  . Smokeless tobacco: Never Used  . Alcohol Use: 0.0 oz/week     Comment: states socially.   OB History    Gravida Para Term Preterm AB TAB SAB Ectopic Multiple Living   4 3 2 1 1 1  0 0 0 3     Review of Systems  Constitutional: Negative for fever  and chills.  HENT: Positive for dental problem. Negative for facial swelling.   Neurological: Positive for headaches.  All other systems reviewed and are negative.     Allergies  Review of patient's allergies indicates no known allergies.  Home Medications   Prior to Admission medications   Medication Sig Start Date End Date Taking? Authorizing Provider  doxycycline (VIBRAMYCIN) 100 MG capsule Take 1 capsule (100 mg total) by mouth 2 (two) times daily. One po bid x 7 days 03/18/15   Mercedes Camprubi-Soms, PA-C  HYDROcodone-acetaminophen (NORCO) 5-325 MG per tablet Take 1 tablet by mouth every 6 (six) hours as needed for severe pain. 03/18/15   Mercedes Camprubi-Soms, PA-C  HYDROcodone-acetaminophen (NORCO/VICODIN) 5-325 MG per tablet Take 2 tablets by mouth every 6 (six) hours as needed for moderate pain.    Historical Provider, MD  naproxen (NAPROSYN) 500 MG tablet Take 1 tablet (500 mg total) by mouth 2 (two) times daily as needed for mild pain, moderate pain or headache (TAKE WITH MEALS.). 03/18/15   Mercedes Camprubi-Soms, PA-C  penicillin v potassium (VEETID) 500 MG tablet Take 1 tablet (500 mg total) by mouth 3 (three) times daily. 05/04/15   Mancel Bale, MD   BP 157/117 mmHg  Pulse 92  Temp(Src) 98.2 F (36.8 C) (Oral)  Resp 18  SpO2 97% Physical Exam  Constitutional: She appears well-developed and  well-nourished. No distress.  Eyes: Conjunctivae are normal.  Neck: Neck supple.  Neurological: She is alert.  Skin: Skin is warm and dry.  Nursing note and vitals reviewed.   ED Course  Procedures (including critical care time) Labs Review Labs Reviewed - No data to display  Imaging Review No results found. I have personally reviewed and evaluated these images and lab results as part of my medical decision-making.   EKG Interpretation None      MDM   Final diagnoses:  Dental implant pain, initial encounter   Patient in emergency department with dental pain. When  I was taking history patient requesting something for pain before I did anything.Patient requested Percocet or Vicodin. Explant to her that it is not recommended to treat dental pain with narcotics, and offered her ibuprofen. Patient stated that she took that already and did not help. I was then trying to examine her, however patient became angry, started using foul language, and requested to see my supervisor. Patient also told me that I was a liar because "I went to [facility] last month ago for some Vicodin from a dental pain." I was not able to examine her. I did get Dr. Effie Shy who examined and discharged her.  Filed Vitals:   05/04/15 0959  BP: 157/117  Pulse: 92  Temp: 98.2 F (36.8 C)  Resp: [address] PA-C 05/04/15 1045  Mancel Bale, MD 05/04/15 1646

## 2015-05-04 NOTE — Discharge Instructions (Signed)
Follow-up with your dentist as soon as possible for further treatment.  For the pain, use ibuprofen or naproxen. Both of these are available over-the-counter.

## 2015-05-04 NOTE — ED Notes (Signed)
Bed: WTR6 Expected date:  Expected time:  Means of arrival:  Comments: 

## 2015-05-06 ENCOUNTER — Telehealth: Payer: Self-pay | Admitting: Internal Medicine

## 2015-05-06 NOTE — Telephone Encounter (Signed)
Call to patient to confirm appointment for 05/07/15 at 3:15 lmtcb ° °

## 2015-05-07 ENCOUNTER — Ambulatory Visit: Payer: Medicaid Other | Admitting: Internal Medicine

## 2015-05-13 ENCOUNTER — Encounter: Payer: Self-pay | Admitting: General Practice

## 2015-05-13 ENCOUNTER — Ambulatory Visit: Payer: Medicaid Other | Admitting: Internal Medicine

## 2015-07-08 ENCOUNTER — Emergency Department (HOSPITAL_COMMUNITY)
Admission: EM | Admit: 2015-07-08 | Discharge: 2015-07-08 | Disposition: A | Discharge status: Home / Self Care | Payer: Medicaid Other | Source: Home / Self Care

## 2015-07-08 ENCOUNTER — Encounter (HOSPITAL_COMMUNITY): Payer: Self-pay

## 2015-07-08 DIAGNOSIS — J029 Acute pharyngitis, unspecified: Secondary | ICD-10-CM

## 2015-07-08 LAB — POCT RAPID STREP A: Streptococcus, Group A Screen (Direct): NEGATIVE

## 2015-07-08 MED ORDER — AMOXICILLIN 500 MG PO CAPS: 500.0000 mg | ORAL_CAPSULE | Freq: Three times a day (TID) | ORAL | Status: DC

## 2015-07-08 NOTE — ED Provider Notes (Signed)
CSN: [id number]     Arrival date & time 07/08/15  1928 History   None    Chief Complaint  Patient presents with  . Sore Throat   (Consider location/radiation/quality/duration/timing/severity/associated sxs/prior Treatment) HPI History obtained from patient:   LOCATION:throat SEVERITY:3 DURATION:1 week CONTEXT:sudden onset QUALITY: MODIFYING FACTORS: OTC medications without relief ASSOCIATED SYMPTOMS: runny nose TIMING:constant OCCUPATION: PCA Past Medical History  Diagnosis Date  . Headache(784.0)     migraines  . Seasonal allergies   . Hypertension     no current meds.  . Asthma     as a child  . Finger fracture, left 12/12/2012    left small P2 fx.   Past Surgical History  Procedure Laterality Date  . Tubal ligation  02/12/2008  . Open reduction internal fixation (orif) metacarpal Left 12/19/2012    Procedure: CLOSED REDUCTION PINNING LEFT SMALL P2 FRACTURE ;  Surgeon: Marlowe ShoresMatthew A Weingold, MD;  Location: [facility];  Service: Orthopedics;  Laterality: Left;   No family history on file. Social History  Substance Use Topics  . Smoking status: Current Every Day Smoker -- 0.50 packs/day for 12 years    Types: Cigarettes  . Smokeless tobacco: Never Used  . Alcohol Use: 0.0 oz/week     Comment: states socially.   OB History    Gravida Para Term Preterm AB TAB SAB Ectopic Multiple Living   4 3 2 1 1 1  0 0 0 3     Review of Systems ROS +'ve sore throat  Denies: HEADACHE, NAUSEA, ABDOMINAL PAIN, CHEST PAIN, CONGESTION, DYSURIA, SHORTNESS OF BREATH  Allergies  Review of patient's allergies indicates no known allergies.  Home Medications   Prior to Admission medications   Medication Sig Start Date End Date Taking? Authorizing Provider  doxycycline (VIBRAMYCIN) 100 MG capsule Take 1 capsule (100 mg total) by mouth 2 (two) times daily. One po bid x 7 days 03/18/15   Mercedes Camprubi-Soms, PA-C  HYDROcodone-acetaminophen (NORCO) 5-325 MG per tablet Take  1 tablet by mouth every 6 (six) hours as needed for severe pain. 03/18/15   Mercedes Camprubi-Soms, PA-C  HYDROcodone-acetaminophen (NORCO/VICODIN) 5-325 MG per tablet Take 2 tablets by mouth every 6 (six) hours as needed for moderate pain.    Historical Provider, MD  naproxen (NAPROSYN) 500 MG tablet Take 1 tablet (500 mg total) by mouth 2 (two) times daily as needed for mild pain, moderate pain or headache (TAKE WITH MEALS.). 03/18/15   Mercedes Camprubi-Soms, PA-C  penicillin v potassium (VEETID) 500 MG tablet Take 1 tablet (500 mg total) by mouth 3 (three) times daily. 05/04/15   Mancel BaleElliott Wentz, MD   Meds Ordered and Administered this Visit  Medications - No data to display  BP 154/97 mmHg  Pulse 94  Temp(Src) 98.4 F (36.9 C) (Oral)  Resp 16  SpO2 96% No data found.   Physical Exam NURSES NOTES AND VITAL SIGNS REVIEWED. CONSTITUTIONAL: Well developed, well nourished, no acute distress HEENT: normocephalic, atraumatic THROAT: Slightly injected with cryptic tonsils on the right. No sign of peritonsillar abscess or cellulitis. No acute exudate noted. Patient does have significant halitosis noted. EYES: Conjunctiva normal NECK:normal ROM, supple PULMONARY:No respiratory distress, normal effort, Lungs: CTAb/l CARDIOVASCULAR: RRR, no murmur ABDOMEN: soft, ND, NT, +'ve BS MUSCULOSKELETAL: Normal ROM of all extremities SKIN: warm and dry without rash PSYCHIATRIC: Mood and affect normal  ED Course  Procedures (including critical care time)  Labs Review Labs Reviewed  POCT RAPID STREP A    Imaging  Review No results found.   Visual Acuity Review  Right Eye Distance:   Left Eye Distance:   Bilateral Distance:    Right Eye Near:   Left Eye Near:    Bilateral Near:         MDM   1. Pharyngitis    Rapid strep test is negative. Discussion with patient pertaining changes in her throat she has significant cryptic tonsils. There is no signs of cellulitis or or tonsillar  abscess. Prescription for amoxicillin is provided patient is encouraged to continue symptomatic treatment at home. She is also advised that this may all be a viral illness and needs to run its course. Instructions of care were provided patient is discharged home in stable condition    Tharon Aquas, [place] 07/08/15 2029

## 2015-07-08 NOTE — ED Notes (Signed)
Patient complains of having a sore throat for the week Also has been having night sweats as well

## 2015-07-08 NOTE — Discharge Instructions (Signed)

## 2015-07-10 LAB — CULTURE, GROUP A STREP: Strep A Culture: NEGATIVE

## 2016-03-26 ENCOUNTER — Ambulatory Visit (INDEPENDENT_AMBULATORY_CARE_PROVIDER_SITE_OTHER): Payer: Medicaid Other

## 2016-03-26 ENCOUNTER — Ambulatory Visit (HOSPITAL_COMMUNITY)
Admission: EM | Admit: 2016-03-26 | Discharge: 2016-03-26 | Disposition: A | Discharge status: Home / Self Care | Payer: Medicaid Other | Attending: Emergency Medicine | Admitting: Emergency Medicine

## 2016-03-26 ENCOUNTER — Encounter (HOSPITAL_COMMUNITY): Payer: Self-pay | Admitting: Emergency Medicine

## 2016-03-26 DIAGNOSIS — M67432 Ganglion, left wrist: Secondary | ICD-10-CM | POA: Diagnosis not present

## 2016-03-26 NOTE — ED Triage Notes (Signed)
Patient reports noticing swelling to left wrist today.  Area is not painful, but limits movement.  Area is soft .   Patient mentions several events over the past few weeks involving an injury to this arm.  Currently has a scratch to left index finger.

## 2016-03-26 NOTE — Discharge Instructions (Signed)
Your xrays today are negative. Please review information on ganglion cyst. These will often resolve on there own but can last for some time. There is no cure. Though they can be drained and or surgically removed they often return. Wear the splint for comfort and to protect the area during sports or other activities where contact with the area is possible. We are providing the name of a hand (orthopedic MD) with whom you can arange follow up if you are interested in having it drained or surgically removed.

## 2016-03-27 NOTE — ED Provider Notes (Signed)
CSN: [id number]     Arrival date & time 03/26/16  1456 History   None    Chief Complaint  Patient presents with  . Wrist Pain   (Consider location/radiation/quality/duration/timing/severity/associated sxs/prior Treatment) Pt reports noticing a tender lump on her wrists this am. She is concerned because yesterday she was moving a heavy piece of furniture and afterwards her bil wrists were painful.    The history is provided by the spouse and the patient.  Wrist Pain  This is a new problem. The current episode started yesterday. The problem occurs constantly. The problem has not changed since onset.Nothing relieves the symptoms. She has tried nothing for the symptoms.    Past Medical History:  Diagnosis Date  . Asthma    as a child  . Finger fracture, left 12/12/2012   left small P2 fx.  . Headache(784.0)    migraines  . Hypertension    no current meds.  . Seasonal allergies    Past Surgical History:  Procedure Laterality Date  . OPEN REDUCTION INTERNAL FIXATION (ORIF) METACARPAL Left 12/19/2012   Procedure: CLOSED REDUCTION PINNING LEFT SMALL P2 FRACTURE ;  Surgeon: Marlowe Shores, MD;  Location: [facility];  Service: Orthopedics;  Laterality: Left;  . TUBAL LIGATION  02/12/2008   No family history on file. Social History  Substance Use Topics  . Smoking status: Current Every Day Smoker    Packs/day: 0.50    Years: 12.00    Types: Cigarettes  . Smokeless tobacco: Never Used  . Alcohol use 0.0 oz/week     Comment: states socially.   OB History    Gravida Para Term Preterm AB Living   4 3 2 1 1 3    SAB TAB Ectopic Multiple Live Births   0 1 0 0       Review of Systems  All other systems reviewed and are negative.   Allergies  Review of patient's allergies indicates no known allergies.  Home Medications   Prior to Admission medications   Medication Sig Start Date End Date Taking? Authorizing Provider  amoxicillin (AMOXIL) 500 MG capsule Take  1 capsule (500 mg total) by mouth 3 (three) times daily. 07/08/15   Tharon Aquas, PA  doxycycline (VIBRAMYCIN) 100 MG capsule Take 1 capsule (100 mg total) by mouth 2 (two) times daily. One po bid x 7 days 03/18/15   Mercedes Camprubi-Soms, PA-C  HYDROcodone-acetaminophen (NORCO) 5-325 MG per tablet Take 1 tablet by mouth every 6 (six) hours as needed for severe pain. 03/18/15   Mercedes Camprubi-Soms, PA-C  HYDROcodone-acetaminophen (NORCO/VICODIN) 5-325 MG per tablet Take 2 tablets by mouth every 6 (six) hours as needed for moderate pain.    Historical Provider, MD  naproxen (NAPROSYN) 500 MG tablet Take 1 tablet (500 mg total) by mouth 2 (two) times daily as needed for mild pain, moderate pain or headache (TAKE WITH MEALS.). 03/18/15   Mercedes Camprubi-Soms, PA-C  penicillin v potassium (VEETID) 500 MG tablet Take 1 tablet (500 mg total) by mouth 3 (three) times daily. 05/04/15   Mancel Bale, MD   Meds Ordered and Administered this Visit  Medications - No data to display  BP 126/85 (BP Location: Right Arm)   Pulse 75   Temp 97.4 F (36.3 C)   Resp 12   LMP 03/25/2016   SpO2 98%  No data found.   Physical Exam  Constitutional: She is oriented to person, place, and time. She appears well-developed.  HENT:  Head: Normocephalic.  Eyes: Conjunctivae are normal.  Musculoskeletal:  Approx 3 cm raised nodule to posterior lateral wrist. TTP. Soft to palpation w/o s/s infectious process. Most c/w ganglion cyst despite hx.   Neurological: She is alert and oriented to person, place, and time.  Skin: Skin is warm.  Psychiatric: She has a normal mood and affect.    Urgent Care Course   Clinical Course    Procedures (including critical care time)  Labs Review Labs Reviewed - No data to display  Imaging Review Dg Wrist Complete Left  Result Date: 03/26/2016 CLINICAL DATA:  Pain and swelling of the left wrist. EXAM: LEFT WRIST - COMPLETE 3+ VIEW COMPARISON:  None. FINDINGS: There is no  evidence of fracture or dislocation. There is no evidence of arthropathy or other focal bone abnormality. Soft tissues are unremarkable. IMPRESSION: Negative. Electronically Signed   By: Ted Mcalpine M.D.   On: 03/26/2016 17:28    Visual Acuity Review  Right Eye Distance:   Left Eye Distance:   Bilateral Distance:    Right Eye Near:   Left Eye Near:    Bilateral Near:         MDM   1. Ganglion cyst of wrist, left   Imaging negative for acute process. Extensive discussion regarding dx and potential treatments options along w/ likely recurrence despite tx. Hand ortho referral provided if pt decides to pursue more aggressive tx options. Dx info an df/u options discussed and provided in print.    Leanne Chang, NP 03/27/16 [phone]

## 2016-06-29 ENCOUNTER — Encounter (HOSPITAL_COMMUNITY): Payer: Self-pay | Admitting: Emergency Medicine

## 2016-06-29 ENCOUNTER — Ambulatory Visit (HOSPITAL_COMMUNITY)
Admission: EM | Admit: 2016-06-29 | Discharge: 2016-06-29 | Disposition: A | Discharge status: Home / Self Care | Payer: Medicaid Other | Attending: Family Medicine | Admitting: Family Medicine

## 2016-06-29 DIAGNOSIS — Z202 Contact with and (suspected) exposure to infections with a predominantly sexual mode of transmission: Secondary | ICD-10-CM | POA: Diagnosis not present

## 2016-06-29 MED ORDER — AZITHROMYCIN 250 MG PO TABS: ORAL_TABLET | ORAL | 0 refills | Status: DC

## 2016-06-29 MED ORDER — CEFTRIAXONE SODIUM 250 MG IJ SOLR
INTRAMUSCULAR | Status: AC
  Filled 2016-06-29: qty 250

## 2016-06-29 MED ORDER — CEFTRIAXONE SODIUM 250 MG IJ SOLR
250.0000 mg | Freq: Once | INTRAMUSCULAR | Status: AC
  Administered 2016-06-29: 250 mg via INTRAMUSCULAR

## 2016-06-29 NOTE — ED Triage Notes (Signed)
[facility] a [facility] an [facility] to an [facility]. The patient stated that her partner called her and told her he was tested positive gonorrhea. The patient denied any symptoms.

## 2016-06-29 NOTE — Discharge Instructions (Signed)
You are receiving treatment for gonorrhea today. After your period is over a hill need to go to the health department for STD screening.

## 2016-06-29 NOTE — ED Provider Notes (Signed)
CSN: [id number]     Arrival date & time 06/29/16  1930 History   First MD Initiated Contact with Patient 06/29/16 2008     Chief Complaint  Patient presents with  . Exposure to STD   (Consider location/radiation/quality/duration/timing/severity/associated sxs/prior Treatment) 30 year old female states she was called by her boyfriend approximately 2 hours ago when told that he was diagnosed with gonorrhea. Patient is here for treatment. She states she has no symptoms. She is currently on her menses and  having a heavy flow.      Past Medical History:  Diagnosis Date  . Asthma    as a child  . Finger fracture, left 12/12/2012   left small P2 fx.  . Headache(784.0)    migraines  . Hypertension    no current meds.  . Seasonal allergies    Past Surgical History:  Procedure Laterality Date  . OPEN REDUCTION INTERNAL FIXATION (ORIF) METACARPAL Left 12/19/2012   Procedure: CLOSED REDUCTION PINNING LEFT SMALL P2 FRACTURE ;  Surgeon: Marlowe ShoresMatthew A Weingold, MD;  Location: [facility];  Service: Orthopedics;  Laterality: Left;  . TUBAL LIGATION  02/12/2008   History reviewed. No pertinent family history. Social History  Substance Use Topics  . Smoking status: Current Every Day Smoker    Packs/day: 0.50    Years: 12.00    Types: Cigarettes  . Smokeless tobacco: Never Used  . Alcohol use 0.0 oz/week     Comment: states socially.   OB History    Gravida Para Term Preterm AB Living   4 3 2 1 1 3    SAB TAB Ectopic Multiple Live Births   0 1 0 0 2     Review of Systems  Constitutional: Negative.   Gastrointestinal: Negative.   Genitourinary: Negative.   All other systems reviewed and are negative.   Allergies  Review of patient's allergies indicates no known allergies.  Home Medications   Prior to Admission medications   Not on File   Meds Ordered and Administered this Visit   Medications  cefTRIAXone (ROCEPHIN) injection 250 mg (not administered)    BP  152/87 (BP Location: Left Arm)   Pulse 73   Temp 98.9 F (37.2 C) (Oral)   Resp 16   LMP 06/29/2016 (Exact Date)   SpO2 100%  No data found.   Physical Exam  Constitutional: She is oriented to person, place, and time. She appears well-developed and well-nourished. No distress.  Eyes: EOM are normal.  Neck: Normal range of motion. Neck supple.  Cardiovascular: Normal rate.   Pulmonary/Chest: Effort normal. No respiratory distress.  Genitourinary:  Genitourinary Comments: Patient refused to allow pelvic due to menstrual flow.  Musculoskeletal: She exhibits no edema.  Neurological: She is alert and oriented to person, place, and time. She exhibits normal muscle tone.  Skin: Skin is warm and dry.  Psychiatric: She has a normal mood and affect.  Nursing note and vitals reviewed.   Urgent Care Course   Clinical Course    Procedures (including critical care time)  Labs Review Labs Reviewed - No data to display  Imaging Review No results found.   Visual Acuity Review  Right Eye Distance:   Left Eye Distance:   Bilateral Distance:    Right Eye Near:   Left Eye Near:    Bilateral Near:         MDM   1. STD exposure    Patient having heavy menstrual flow and declines pelvic exam. ou are  receiving treatment for gonorrhea today. After your period is over a hill need to go to the health department for STD screening. Meds ordered this encounter  Medications  . cefTRIAXone (ROCEPHIN) injection 250 mg   Meds ordered this encounter  Medications  . cefTRIAXone (ROCEPHIN) injection 250 mg  . azithromycin (ZITHROMAX) 250 MG tablet    Sig: Take all 4 tabs po now    Dispense:  4 each    Refill:  0    Order Specific Question:   Supervising Provider    Answer:   Bradd CanaryKINDL, JAMES D [[id number]]   Only one dose rocephin adm     Hayden Rasmussenavid Lyn Deemer, NP 06/29/16 2031    Hayden Rasmussenavid Dillard Pascal, NP 06/29/16 2040

## 2016-09-22 IMAGING — DX DG WRIST COMPLETE 3+V*L*
4 series · 4 of 4 positions shown · non-contrast
Comparison: None.

CLINICAL DATA: Pain and swelling of the left wrist.

EXAM:
LEFT WRIST - COMPLETE 3+ VIEW

[wrist pa]
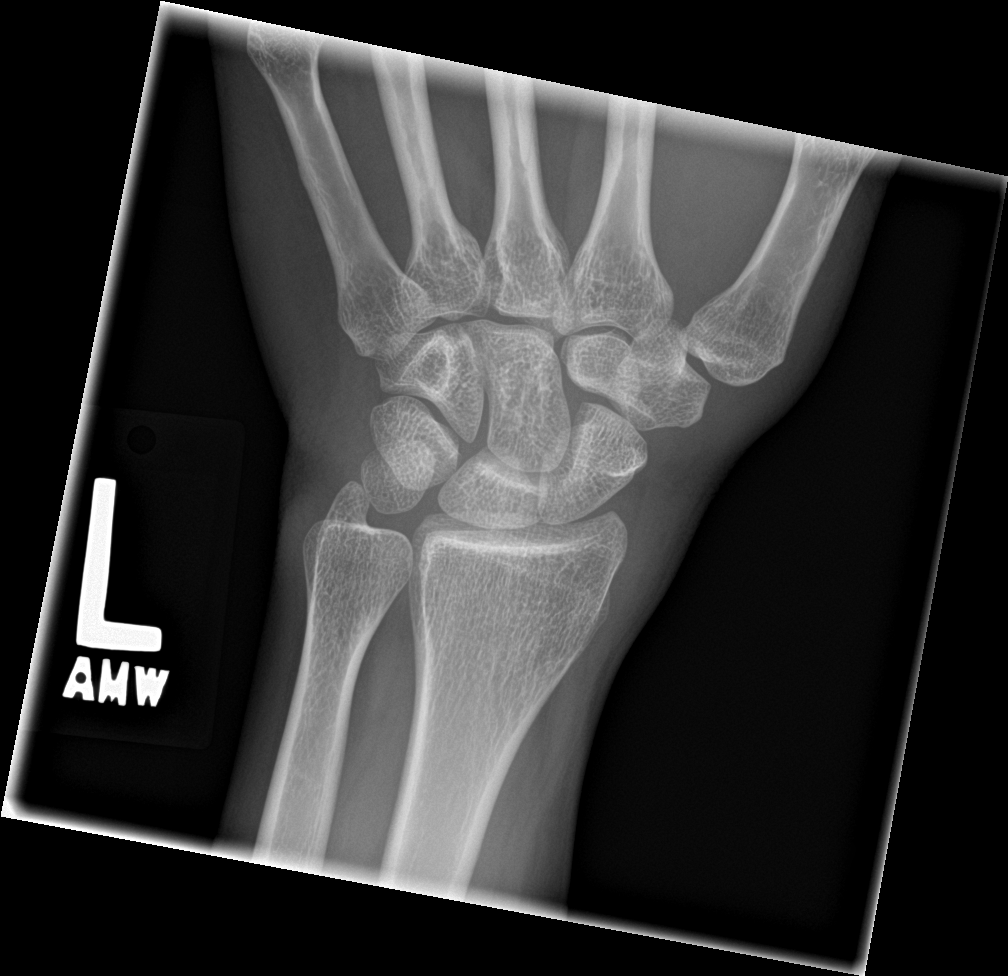

[wrist navicular]
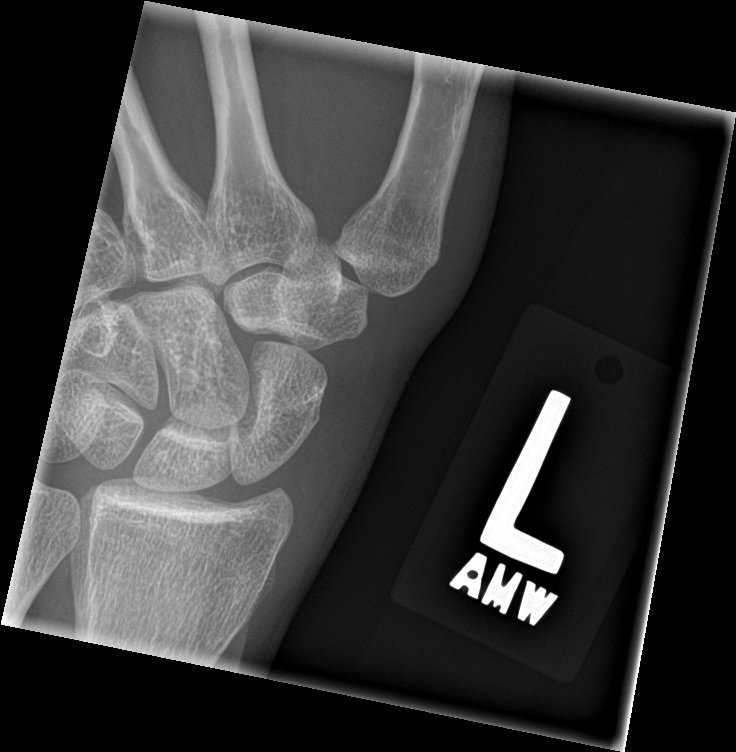

[wrist obl]
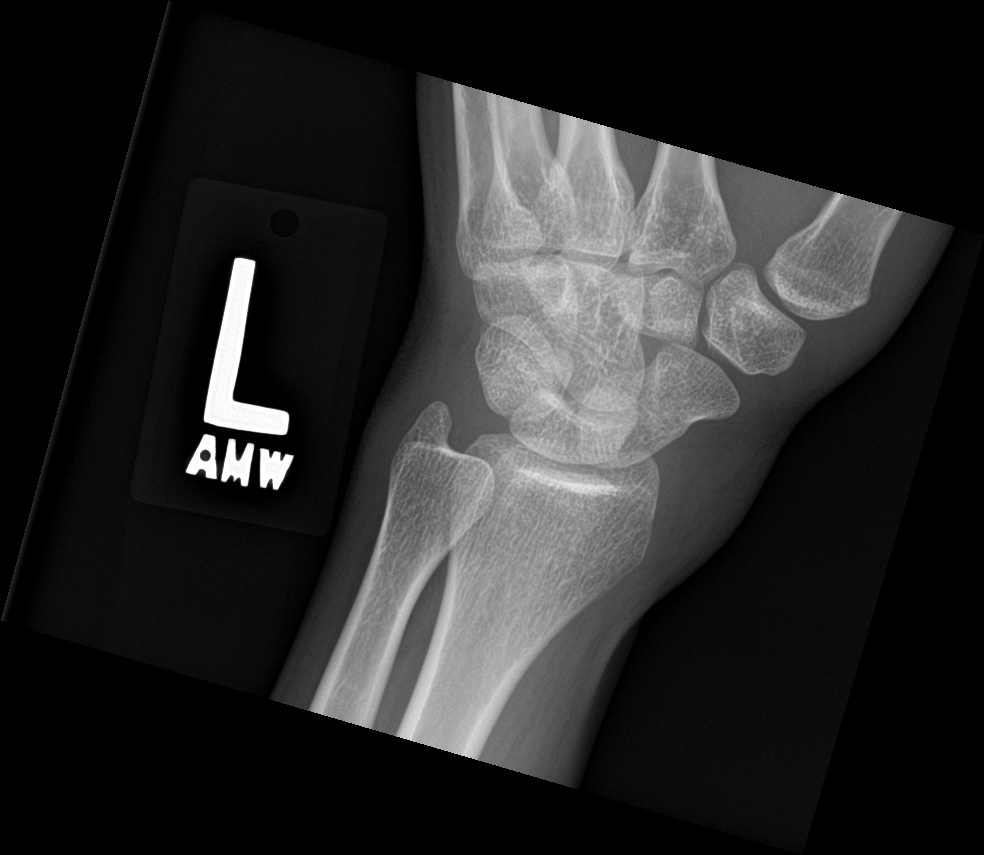

[wrist lat]
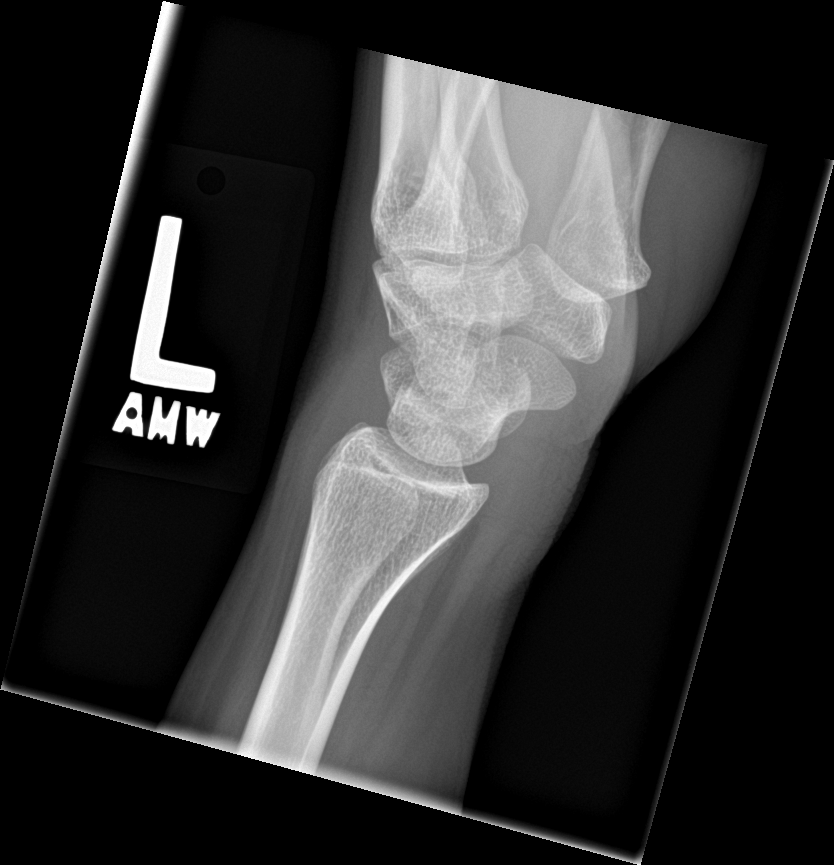

[4 of 4 positions shown; findings below may reference images not displayed]

FINDINGS: There is no evidence of fracture or dislocation. There is no
evidence of arthropathy or other focal bone abnormality. Soft
tissues are unremarkable.
IMPRESSION: Negative.

## 2017-01-11 ENCOUNTER — Other Ambulatory Visit (HOSPITAL_COMMUNITY)
Admission: RE | Admit: 2017-01-11 | Discharge: 2017-01-11 | Disposition: A | Discharge status: Home / Self Care | Payer: Medicaid Other | Source: Ambulatory Visit | Attending: Obstetrics | Admitting: Obstetrics

## 2017-01-11 ENCOUNTER — Ambulatory Visit (INDEPENDENT_AMBULATORY_CARE_PROVIDER_SITE_OTHER): Payer: Medicaid Other | Admitting: Obstetrics

## 2017-01-11 ENCOUNTER — Encounter: Payer: Self-pay | Admitting: Obstetrics

## 2017-01-11 VITALS — BP 150/93 | HR 78 | Ht 66.0 in | Wt 176.6 lb

## 2017-01-11 DIAGNOSIS — M4134 Thoracogenic scoliosis, thoracic region: Secondary | ICD-10-CM

## 2017-01-11 DIAGNOSIS — Z23 Encounter for immunization: Secondary | ICD-10-CM

## 2017-01-11 DIAGNOSIS — Z01419 Encounter for gynecological examination (general) (routine) without abnormal findings: Secondary | ICD-10-CM

## 2017-01-11 DIAGNOSIS — N926 Irregular menstruation, unspecified: Secondary | ICD-10-CM

## 2017-01-11 DIAGNOSIS — Z3202 Encounter for pregnancy test, result negative: Secondary | ICD-10-CM

## 2017-01-11 DIAGNOSIS — N898 Other specified noninflammatory disorders of vagina: Secondary | ICD-10-CM | POA: Insufficient documentation

## 2017-01-11 DIAGNOSIS — A64 Unspecified sexually transmitted disease: Secondary | ICD-10-CM

## 2017-01-11 LAB — POCT URINE PREGNANCY: Preg Test, Ur: NEGATIVE

## 2017-01-11 MED ORDER — CYCLOBENZAPRINE HCL 10 MG PO TABS: 10.0000 mg | ORAL_TABLET | Freq: Three times a day (TID) | ORAL | 2 refills | Status: DC | PRN

## 2017-01-11 MED ORDER — IBUPROFEN 800 MG PO TABS: 800.0000 mg | ORAL_TABLET | Freq: Three times a day (TID) | ORAL | 5 refills | Status: DC | PRN

## 2017-01-11 NOTE — Progress Notes (Signed)
Pt presents for annual, pap, and STD testing. [facility] Pt requested Tdap today.

## 2017-01-12 LAB — CYTOLOGY - PAP
Diagnosis: NEGATIVE
HPV: NOT DETECTED

## 2017-01-12 LAB — CERVICOVAGINAL ANCILLARY ONLY
BACTERIAL VAGINITIS: POSITIVE — AB
CHLAMYDIA, DNA PROBE: NEGATIVE
Candida vaginitis: NEGATIVE
NEISSERIA GONORRHEA: NEGATIVE
TRICH (WINDOWPATH): POSITIVE — AB

## 2017-01-12 NOTE — Progress Notes (Signed)
Subjective:        Cassandra Nicholson is a 31 y.o. female here for a routine exam.  Current complaints: Severe backache from scoliosis.    Personal health questionnaire:  Is patient Ashkenazi Jewish, have a family history of breast and/or ovarian cancer: no Is there a family history of uterine cancer diagnosed at age < 51, gastrointestinal cancer, urinary tract cancer, family member who is a Personnel officer syndrome-associated carrier: no Is the patient overweight and hypertensive, family history of diabetes, personal history of gestational diabetes, preeclampsia or PCOS: no Is patient over 70, have PCOS,  family history of premature CHD under age 35, diabetes, smoke, have hypertension or peripheral artery disease:  no At any time, has a partner hit, kicked or otherwise hurt or frightened you?: no Over the past 2 weeks, have you felt down, depressed or hopeless?: no Over the past 2 weeks, have you felt little interest or pleasure in doing things?:no   Gynecologic History Patient's last menstrual period was 12/21/2016. Contraception: tubal ligation Last Pap: Unknown. Results were: normal Last mammogram: n/a. Results were: n/a  Obstetric History OB History  Gravida Para Term Preterm AB Living  4 3 2 1 1 3   SAB TAB Ectopic Multiple Live Births  0 1 0 0 3    # Outcome Date GA Lbr Len/2nd Weight Sex Delivery Anes PTL Lv  4 Term 02/12/08   6 lb (2.722 kg) F Vag-Spont EPI  LIV  3 Term 07/17/05   6 lb 3 oz (2.807 kg) F Vag-Spont   LIV  2 Preterm 10/24/02   3 lb 6 oz (1.531 kg) F Vag-Spont None  LIV  1 TAB               Past Medical History:  Diagnosis Date  . Asthma    as a child  . Finger fracture, left 12/12/2012   left small P2 fx.  . Headache(784.0)    migraines  . Hypertension    no current meds.  . Mild scoliosis   . Seasonal allergies     Past Surgical History:  Procedure Laterality Date  . OPEN REDUCTION INTERNAL FIXATION (ORIF) METACARPAL Left 12/19/2012   Procedure:  CLOSED REDUCTION PINNING LEFT SMALL P2 FRACTURE ;  Surgeon: Marlowe Shores, MD;  Location: [facility];  Service: Orthopedics;  Laterality: Left;  . TUBAL LIGATION  02/12/2008     Current Outpatient Prescriptions:  .  ibuprofen (ADVIL,MOTRIN) 200 MG tablet, Take 200 mg by mouth every 6 (six) hours as needed., Disp: , Rfl:  .  azithromycin (ZITHROMAX) 250 MG tablet, Take all 4 tabs po now (Patient not taking: Reported on 01/11/2017), Disp: 4 each, Rfl: 0 .  cyclobenzaprine (FLEXERIL) 10 MG tablet, Take 1 tablet (10 mg total) by mouth every 8 (eight) hours as needed for muscle spasms., Disp: 30 tablet, Rfl: 2 .  ibuprofen (ADVIL,MOTRIN) 800 MG tablet, Take 1 tablet (800 mg total) by mouth every 8 (eight) hours as needed., Disp: 30 tablet, Rfl: 5 No Known Allergies  Social History  Substance Use Topics  . Smoking status: Current Every Day Smoker    Packs/day: 0.50    Years: 12.00    Types: Cigarettes  . Smokeless tobacco: Never Used  . Alcohol use 0.0 oz/week     Comment: states socially.    Family History  Problem Relation Age of Onset  . Hypertension Mother   . Scoliosis Mother   . Hypertension Father   .  Scoliosis Father   . Hypertension Maternal Grandmother   . Kidney failure Maternal Grandmother   . Heart failure Maternal Grandmother   . Arthritis Maternal Grandmother   . Scoliosis Maternal Grandmother   . Osteoporosis Maternal Grandmother   . Hypertension Maternal Grandfather   . Osteoporosis Maternal Grandfather   . Hypertension Paternal Grandmother   . Osteoporosis Paternal Grandmother   . Arthritis Paternal Grandmother   . Breast cancer Maternal Aunt   . Lung cancer Maternal Aunt       Review of Systems  Constitutional: negative for fatigue and weight loss Respiratory: negative for cough and wheezing Cardiovascular: negative for chest pain, fatigue and palpitations Gastrointestinal: negative for abdominal pain and change in bowel  habits Musculoskeletal:negative for myalgias Neurological: negative for gait problems and tremors Behavioral/Psych: negative for abusive relationship, depression Endocrine: negative for temperature intolerance    Genitourinary:negative for abnormal menstrual periods, genital lesions, hot flashes, sexual problems and vaginal discharge Integument/breast: negative for breast lump, breast tenderness, nipple discharge and skin lesion(s)    Objective:       BP (!) 150/93   Pulse 78   Ht 5\' 6"  (1.676 m)   Wt 176 lb 9.6 oz (80.1 kg)   LMP 12/21/2016   BMI 28.50 kg/m  General:   alert  Skin:   no rash or abnormalities  Lungs:   clear to auscultation bilaterally  Heart:   regular rate and rhythm, S1, S2 normal, no murmur, click, rub or gallop  Breasts:   normal without suspicious masses, skin or nipple changes or axillary nodes  Abdomen:  normal findings: no organomegaly, soft, non-tender and no hernia  Pelvis:  External genitalia: normal general appearance Urinary system: urethral meatus normal and bladder without fullness, nontender Vaginal: normal without tenderness, induration or masses Cervix: normal appearance Adnexa: normal bimanual exam Uterus: anteverted and non-tender, normal size   Lab Review Urine pregnancy test Labs reviewed yes Radiologic studies reviewed no  50% of 20 min visit spent on counseling and coordination of care.    Assessment:    Healthy female exam.    Scoliosis   Plan:   Ibuprofen / Flexeril Rx Referred to Orthopedist   Education reviewed: calcium supplements, depression evaluation, low fat, low cholesterol diet, safe sex/STD prevention, self breast exams, skin cancer screening, smoking cessation and weight bearing exercise. Follow up in: 1 year.   Meds ordered this encounter  Medications  . ibuprofen (ADVIL,MOTRIN) 200 MG tablet    Sig: Take 200 mg by mouth every 6 (six) hours as needed.  Marland Kitchen ibuprofen (ADVIL,MOTRIN) 800 MG tablet    Sig: Take  1 tablet (800 mg total) by mouth every 8 (eight) hours as needed.    Dispense:  30 tablet    Refill:  5  . cyclobenzaprine (FLEXERIL) 10 MG tablet    Sig: Take 1 tablet (10 mg total) by mouth every 8 (eight) hours as needed for muscle spasms.    Dispense:  30 tablet    Refill:  2   Orders Placed This Encounter  Procedures  . Tdap vaccine greater than or equal to 7yo IM  . Hepatitis B surface antigen  . Hepatitis C antibody  . HIV antibody  . RPR  . Ambulatory referral to Orthopedics    Referral Priority:   Routine    Referral Type:   Consultation    Number of Visits Requested:   1  . POCT urine pregnancy     Patient ID: Sidney Ace,  female   DOB: 12/03/1985, 31 y.o.   MRN: [id number]

## 2017-01-13 ENCOUNTER — Telehealth: Payer: Self-pay

## 2017-01-13 ENCOUNTER — Other Ambulatory Visit: Payer: Self-pay | Admitting: Obstetrics

## 2017-01-13 DIAGNOSIS — A5901 Trichomonal vulvovaginitis: Secondary | ICD-10-CM

## 2017-01-13 DIAGNOSIS — B9689 Other specified bacterial agents as the cause of diseases classified elsewhere: Secondary | ICD-10-CM

## 2017-01-13 DIAGNOSIS — N760 Acute vaginitis: Principal | ICD-10-CM

## 2017-01-13 MED ORDER — TINIDAZOLE 500 MG PO TABS: 2.0000 g | ORAL_TABLET | Freq: Every day | ORAL | 0 refills | Status: DC

## 2017-01-13 NOTE — Telephone Encounter (Signed)
Pt returned call. Results reviewed with pt, and she was informed that her rx was sent to the pharmacy for tx. Also advised to inform partner to be treated as well and to abstain from se until tx has been completed

## 2017-01-19 ENCOUNTER — Other Ambulatory Visit: Payer: Self-pay

## 2017-01-19 DIAGNOSIS — B9689 Other specified bacterial agents as the cause of diseases classified elsewhere: Secondary | ICD-10-CM

## 2017-01-19 DIAGNOSIS — N760 Acute vaginitis: Principal | ICD-10-CM

## 2017-01-19 MED ORDER — METRONIDAZOLE 500 MG PO TABS: 500.0000 mg | ORAL_TABLET | Freq: Two times a day (BID) | ORAL | 0 refills | Status: AC

## 2017-09-09 ENCOUNTER — Encounter (HOSPITAL_COMMUNITY): Payer: Self-pay

## 2017-09-09 ENCOUNTER — Inpatient Hospital Stay (HOSPITAL_COMMUNITY)
Admission: AD | Admit: 2017-09-09 | Discharge: 2017-09-09 | Disposition: A | Discharge status: Home / Self Care | Payer: Medicaid Other | Source: Ambulatory Visit | Attending: Obstetrics and Gynecology | Admitting: Obstetrics and Gynecology

## 2017-09-09 DIAGNOSIS — M25552 Pain in left hip: Secondary | ICD-10-CM | POA: Insufficient documentation

## 2017-09-09 DIAGNOSIS — N760 Acute vaginitis: Secondary | ICD-10-CM | POA: Diagnosis not present

## 2017-09-09 DIAGNOSIS — B9689 Other specified bacterial agents as the cause of diseases classified elsewhere: Secondary | ICD-10-CM | POA: Diagnosis not present

## 2017-09-09 DIAGNOSIS — N898 Other specified noninflammatory disorders of vagina: Secondary | ICD-10-CM

## 2017-09-09 DIAGNOSIS — F17210 Nicotine dependence, cigarettes, uncomplicated: Secondary | ICD-10-CM | POA: Diagnosis not present

## 2017-09-09 LAB — WET PREP, GENITAL
SPERM: NONE SEEN
Trich, Wet Prep: NONE SEEN
Yeast Wet Prep HPF POC: NONE SEEN

## 2017-09-09 LAB — URINALYSIS, ROUTINE W REFLEX MICROSCOPIC
Bilirubin Urine: NEGATIVE
GLUCOSE, UA: NEGATIVE mg/dL
HGB URINE DIPSTICK: NEGATIVE
Ketones, ur: NEGATIVE mg/dL
Leukocytes, UA: NEGATIVE
Nitrite: NEGATIVE
Protein, ur: NEGATIVE mg/dL
Specific Gravity, Urine: 1.023 (ref 1.005–1.030)
pH: 6 (ref 5.0–8.0)

## 2017-09-09 LAB — GC/CHLAMYDIA PROBE AMP (~~LOC~~) NOT AT ARMC
Chlamydia: NEGATIVE
NEISSERIA GONORRHEA: NEGATIVE

## 2017-09-09 LAB — POCT PREGNANCY, URINE: PREG TEST UR: NEGATIVE

## 2017-09-09 MED ORDER — METRONIDAZOLE 500 MG PO TABS: 500.0000 mg | ORAL_TABLET | Freq: Two times a day (BID) | ORAL | 0 refills | Status: DC

## 2017-09-09 NOTE — MAU Note (Signed)
Patient presents with cloudy white discharge, itching, found out boyfriend was cheated on her, having left hip pain.

## 2017-09-09 NOTE — MAU Provider Note (Signed)
History   32 yo BF in with c/o white vag discharge for two weeks and when questioned her boyfriend she found out he was unfaithful in relationship. She is now concerned she has STD. Also c/o of left hip pain when she is up ambulating.  CSN: [id number]  Arrival date & time 09/09/17  0943   None     Chief Complaint  Patient presents with  . Hip Pain  . Vaginal Discharge    HPI  Past Medical History:  Diagnosis Date  . Asthma    as a child  . Finger fracture, left 12/12/2012   left small P2 fx.  . Headache(784.0)    migraines  . Hypertension    no current meds.  . Mild scoliosis   . Seasonal allergies     Past Surgical History:  Procedure Laterality Date  . OPEN REDUCTION INTERNAL FIXATION (ORIF) METACARPAL Left 12/19/2012   Procedure: CLOSED REDUCTION PINNING LEFT SMALL P2 FRACTURE ;  Surgeon: Marlowe Shores, MD;  Location: [facility];  Service: Orthopedics;  Laterality: Left;  . TUBAL LIGATION  02/12/2008    Family History  Problem Relation Age of Onset  . Hypertension Mother   . Scoliosis Mother   . Hypertension Father   . Scoliosis Father   . Hypertension Maternal Grandmother   . Kidney failure Maternal Grandmother   . Heart failure Maternal Grandmother   . Arthritis Maternal Grandmother   . Scoliosis Maternal Grandmother   . Osteoporosis Maternal Grandmother   . Hypertension Maternal Grandfather   . Osteoporosis Maternal Grandfather   . Hypertension Paternal Grandmother   . Osteoporosis Paternal Grandmother   . Arthritis Paternal Grandmother   . Breast cancer Maternal Aunt   . Lung cancer Maternal Aunt     Social History   Tobacco Use  . Smoking status: Current Every Day Smoker    Packs/day: 0.50    Years: 12.00    Pack years: 6.00    Types: Cigarettes  . Smokeless tobacco: Never Used  Substance Use Topics  . Alcohol use: Yes    Alcohol/week: 0.0 oz    Comment: states socially.  . Drug use: Yes    Types: Marijuana    OB  History    Gravida Para Term Preterm AB Living   4 3 2 1 1 3    SAB TAB Ectopic Multiple Live Births   0 1 0 0 3      Review of Systems  Constitutional: Negative.   HENT: Negative.   Eyes: Negative.   Respiratory: Negative.   Cardiovascular: Negative.   Gastrointestinal: Negative.   Endocrine: Negative.   Genitourinary: Positive for vaginal discharge.  Musculoskeletal: Negative.   Skin: Negative.   Allergic/Immunologic: Negative.   Neurological: Negative.   Hematological: Negative.   Psychiatric/Behavioral: Negative.     Allergies  Patient has no known allergies.  Home Medications    BP (!) 157/93 (BP Location: Right Arm)   Pulse (!) 106   Temp 98.3 F (36.8 C) (Oral)   Resp 16   Ht 5\' 11"  (1.803 m)   Wt 189 lb (85.7 kg)   LMP 08/09/2017   BMI 26.36 kg/m   Physical Exam  Constitutional: She is oriented to person, place, and time. She appears well-developed and well-nourished.  HENT:  Head: Normocephalic.  Neck: Normal range of motion.  Cardiovascular: Normal rate, regular rhythm, normal heart sounds and intact distal pulses.  Pulmonary/Chest: Effort normal and breath sounds normal.  Abdominal: Soft.  Bowel sounds are normal.  Genitourinary: Vaginal discharge found.  Musculoskeletal: Normal range of motion.  Neurological: She is alert and oriented to person, place, and time. She has normal reflexes.  Skin: Skin is warm and dry.  Psychiatric: She has a normal mood and affect. Her behavior is normal. Judgment and thought content normal.    MAU Course  Procedures (including critical care time)  Labs Reviewed  WET PREP, GENITAL  URINALYSIS, ROUTINE W REFLEX MICROSCOPIC  GC/CHLAMYDIA PROBE AMP ([place]) NOT AT [facility]   No results found.   No diagnosis found.    MDM  Wet prep . Cultures done. Discussed STD prevention. And suggested to see primary care provider regarding hip discomfort. Will treat for BV and d/c home

## 2017-09-09 NOTE — Discharge Instructions (Signed)

## 2018-11-12 ENCOUNTER — Other Ambulatory Visit: Payer: Self-pay

## 2018-11-12 ENCOUNTER — Ambulatory Visit (HOSPITAL_COMMUNITY)
Admission: EM | Admit: 2018-11-12 | Discharge: 2018-11-12 | Disposition: A | Discharge status: Home / Self Care | Payer: Medicaid Other | Attending: Family Medicine | Admitting: Family Medicine

## 2018-11-12 ENCOUNTER — Encounter (HOSPITAL_COMMUNITY): Payer: Self-pay | Admitting: Emergency Medicine

## 2018-11-12 DIAGNOSIS — N760 Acute vaginitis: Secondary | ICD-10-CM | POA: Insufficient documentation

## 2018-11-12 MED ORDER — METRONIDAZOLE 500 MG PO TABS: 500.0000 mg | ORAL_TABLET | Freq: Two times a day (BID) | ORAL | 0 refills | Status: AC

## 2018-11-12 NOTE — ED Provider Notes (Signed)
[facility]    CSN: [id number] Arrival date & time: 11/12/18  1448     History   Chief Complaint Chief Complaint  Patient presents with  . Vaginal Itching    HPI Cassandra Nicholson is a 33 y.o. female.   [name] presents with complaints of vaginal itching and some irritation as well as "light cloudy" discharge. Started approximately 2.5 weeks ago. No pelvic pain, no external sores or lesions. Similar to BV she has had in the past. States she recently has started using scent beads in her laundry. Uses vagisil soap as well. No urinary symptoms. Her periods are irregular, which is normal for her, she has had a tubal ligation. She has one partner, doesn't use condoms. Denies any known STI exposure. No gi complaints.     ROS per HPI, negative if not otherwise mentioned.      Past Medical History:  Diagnosis Date  . Asthma    as a child  . Finger fracture, left 12/12/2012   left small P2 fx.  . Headache(784.0)    migraines  . Hypertension    no current meds.  . Mild scoliosis   . Seasonal allergies     There are no active problems to display for this patient.   Past Surgical History:  Procedure Laterality Date  . OPEN REDUCTION INTERNAL FIXATION (ORIF) METACARPAL Left 12/19/2012   Procedure: CLOSED REDUCTION PINNING LEFT SMALL P2 FRACTURE ;  Surgeon: Marlowe Shores, MD;  Location: [facility];  Service: Orthopedics;  Laterality: Left;  . TUBAL LIGATION  02/12/2008    OB History    Gravida  4   Para  3   Term  2   Preterm  1   AB  1   Living  3     SAB  0   TAB  1   Ectopic  0   Multiple  0   Live Births  3            Home Medications    Prior to Admission medications   Medication Sig Start Date End Date Taking? Authorizing Provider  benzocaine-resorcinol (VAGISIL) 5-2 % vaginal cream Place vaginally at bedtime.   Yes [provider]  ibuprofen (ADVIL,MOTRIN) 200 MG tablet Take 200 mg by mouth every 6  (six) hours as needed for moderate pain.     [provider]  metroNIDAZOLE (FLAGYL) 500 MG tablet Take 1 tablet (500 mg total) by mouth 2 (two) times daily for 7 days. 11/12/18 11/19/18  Georgetta Haber, NP    Family History Family History  Problem Relation Age of Onset  . Hypertension Mother   . Scoliosis Mother   . Hypertension Father   . Scoliosis Father   . Hypertension Maternal Grandmother   . Kidney failure Maternal Grandmother   . Heart failure Maternal Grandmother   . Arthritis Maternal Grandmother   . Scoliosis Maternal Grandmother   . Osteoporosis Maternal Grandmother   . Hypertension Maternal Grandfather   . Osteoporosis Maternal Grandfather   . Hypertension Paternal Grandmother   . Osteoporosis Paternal Grandmother   . Arthritis Paternal Grandmother   . Breast cancer Maternal Aunt   . Lung cancer Maternal Aunt     Social History Social History   Tobacco Use  . Smoking status: Current Every Day Smoker    Packs/day: 0.50    Years: 12.00    Pack years: 6.00    Types: Cigarettes  . Smokeless tobacco: Never  Used  Substance Use Topics  . Alcohol use: Yes    Comment: states socially.  . Drug use: Yes    Types: Marijuana     Allergies   Patient has no known allergies.   Review of Systems Review of Systems   Physical Exam Triage Vital Signs ED Triage Vitals  Enc Vitals Group     BP 11/12/18 0946 (!) 139/94     Pulse Rate 11/12/18 0946 68     Resp 11/12/18 0946 16     Temp 11/12/18 0946 98.4 F (36.9 C)     Temp Source 11/12/18 0946 Oral     SpO2 11/12/18 0946 100 %     Weight --      Height --      Head Circumference --      Peak Flow --      Pain Score 11/12/18 0958 0     Pain Loc --      Pain Edu? --      Excl. in GC? --    No data found.  Updated Vital Signs BP (!) 139/94 (BP Location: Left Arm)   Pulse 68   Temp 98.4 F (36.9 C) (Oral)   Resp 16   SpO2 100%   Visual Acuity Right Eye Distance:   Left Eye Distance:    Bilateral Distance:    Right Eye Near:   Left Eye Near:    Bilateral Near:     Physical Exam Constitutional:      General: She is not in acute distress.    Appearance: She is well-developed.  Cardiovascular:     Rate and Rhythm: Normal rate and regular rhythm.     Heart sounds: Normal heart sounds.  Pulmonary:     Effort: Pulmonary effort is normal.     Breath sounds: Normal breath sounds.  Abdominal:     Palpations: Abdomen is soft. Abdomen is not rigid.     Tenderness: There is no abdominal tenderness. There is no guarding or rebound.  Genitourinary:    Comments: Denies sores, lesions, vaginal bleeding; no pelvic pain; gu exam deferred at this time, vaginal self swab collected.   Skin:    General: Skin is warm and dry.  Neurological:     Mental Status: She is alert and oriented to person, place, and time.      UC Treatments / Results  Labs (all labs ordered are listed, but only abnormal results are displayed) Labs Reviewed  CERVICOVAGINAL ANCILLARY ONLY    EKG None  Radiology No results found.  Procedures Procedures (including critical care time)  Medications Ordered in UC Medications - No data to display  Initial Impression / Assessment and Plan / UC Course  I have reviewed the triage vital signs and the nursing notes.  Pertinent labs & imaging results that were available during my care of the patient were reviewed by me and considered in my medical decision making (see chart for details).     Treatment for BV initiated with vaginal cytology pending. Will notify of any positive findings and if any changes to treatment are needed.  Patient verbalized understanding and agreeable to plan.    Final Clinical Impressions(s) / UC Diagnoses   Final diagnoses:  Acute vaginitis     Discharge Instructions     Will notify you of any positive findings and if any changes to treatment are needed.   Complete course of antibiotics.  Do not drink alcohol while  taking.  Avoid  intercourse until medications complete.  If symptoms worsen or do not improve in the next week to return to be seen or to follow up with your PCP.     ED Prescriptions    Medication Sig Dispense Auth. Provider   metroNIDAZOLE (FLAGYL) 500 MG tablet Take 1 tablet (500 mg total) by mouth 2 (two) times daily for 7 days. 14 tablet Georgetta Haber, NP     Controlled Substance Prescriptions [place] Controlled Substance Registry consulted? Not Applicable   Georgetta Haber, NP 11/12/18 1021

## 2018-11-12 NOTE — Discharge Instructions (Signed)
Will notify you of any positive findings and if any changes to treatment are needed.   Complete course of antibiotics.  Do not drink alcohol while taking.  Avoid intercourse until medications complete.  If symptoms worsen or do not improve in the next week to return to be seen or to follow up with your PCP.

## 2018-11-12 NOTE — ED Triage Notes (Signed)
Vaginal irritation for 2-2.5 weeks.  Patient has been changing washing and bathing soaps.  Patient has had discharge.

## 2018-11-13 LAB — CERVICOVAGINAL ANCILLARY ONLY
Bacterial vaginitis: POSITIVE — AB
Candida vaginitis: POSITIVE — AB
Chlamydia: NEGATIVE
Neisseria Gonorrhea: NEGATIVE
Trichomonas: POSITIVE — AB

## 2018-11-15 ENCOUNTER — Telehealth (HOSPITAL_COMMUNITY): Payer: Self-pay | Admitting: Emergency Medicine

## 2018-11-15 MED ORDER — FLUCONAZOLE 150 MG PO TABS: 150.0000 mg | ORAL_TABLET | Freq: Once | ORAL | 0 refills | Status: AC

## 2018-11-15 NOTE — Telephone Encounter (Signed)
Bacterial Vaginosis test is positive.  Prescription for metronidazole was given at the urgent care visit. Pt contacted regarding results. Answered all questions. Verbalized understanding.  Trichomonas is positive. Rx metronidazole was given at the urgent care visit. Pt needs education to please refrain from sexual intercourse for 7 days to give the medicine time to work. Sexual partners need to be notified and tested/treated. Condoms may reduce risk of reinfection. Recheck for further evaluation if symptoms are not improving.   Test for candida (yeast) was positive.  Prescription for fluconazole 150mg  po now, repeat dose in 3d if needed, #2 no refills, sent to the pharmacy of record.  Recheck or followup with PCP for further evaluation if symptoms are not improving.    Patient contacted and made aware of all results, all questions answered.

## 2020-09-03 ENCOUNTER — Ambulatory Visit (HOSPITAL_COMMUNITY)
Admission: EM | Admit: 2020-09-03 | Discharge: 2020-09-03 | Disposition: A | Discharge status: Home / Self Care | Payer: Medicaid Other | Attending: Internal Medicine | Admitting: Internal Medicine

## 2020-09-03 ENCOUNTER — Encounter (HOSPITAL_COMMUNITY): Payer: Self-pay

## 2020-09-03 ENCOUNTER — Other Ambulatory Visit: Payer: Self-pay

## 2020-09-03 DIAGNOSIS — S39012A Strain of muscle, fascia and tendon of lower back, initial encounter: Secondary | ICD-10-CM

## 2020-09-03 MED ORDER — KETOROLAC TROMETHAMINE 60 MG/2ML IM SOLN
INTRAMUSCULAR | Status: AC
  Filled 2020-09-03: qty 2

## 2020-09-03 MED ORDER — KETOROLAC TROMETHAMINE 60 MG/2ML IM SOLN
60.0000 mg | Freq: Once | INTRAMUSCULAR | Status: AC
  Administered 2020-09-03: 60 mg via INTRAMUSCULAR

## 2020-09-03 MED ORDER — IBUPROFEN 800 MG PO TABS: 800.0000 mg | ORAL_TABLET | Freq: Three times a day (TID) | ORAL | 0 refills | Status: DC | PRN

## 2020-09-03 NOTE — ED Provider Notes (Signed)
[facility]    CSN: [id number] Arrival date & time: 09/03/20  1254      History   Chief Complaint Chief Complaint  Patient presents with  . Back Pain    HPI Cassandra Nicholson is a 35 y.o. female presents to urgent care with complaints of back pain.  Patient reports onset of left sided hip and back pain while lifting heavy boxes at work Friday.  Pain became worse after falling on snow Monday.  Patient denies any other injury with fall.  Low back pain described as stabbing and burning, worse with any movement.  Pain unrelieved with previously prescribed baclofen and Celebrex.  Patient denies any radicular type pain or numbness going down left leg, no bowel or bladder incontinence.  Able to ambulate into room.   Past Medical History:  Diagnosis Date  . Asthma    as a child  . Finger fracture, left 12/12/2012   left small P2 fx.  . Headache(784.0)    migraines  . Hypertension    no current meds.  . Mild scoliosis   . Seasonal allergies     There are no problems to display for this patient.   Past Surgical History:  Procedure Laterality Date  . OPEN REDUCTION INTERNAL FIXATION (ORIF) METACARPAL Left 12/19/2012   Procedure: CLOSED REDUCTION PINNING LEFT SMALL P2 FRACTURE ;  Surgeon: Schuyler Amor, MD;  Location: [facility];  Service: Orthopedics;  Laterality: Left;  . TUBAL LIGATION  02/12/2008    OB History    Gravida  4   Para  3   Term  2   Preterm  1   AB  1   Living  3     SAB  0   IAB  1   Ectopic  0   Multiple  0   Live Births  3            Home Medications    Prior to Admission medications   Medication Sig Start Date End Date Taking? Authorizing Provider  ibuprofen (ADVIL) 800 MG tablet Take 1 tablet (800 mg total) by mouth every 8 (eight) hours as needed. 09/03/20  Yes Rudolpho Sevin, NP  benzocaine-resorcinol (VAGISIL) 5-2 % vaginal cream Place vaginally at bedtime.    [provider]    Family  History Family History  Problem Relation Age of Onset  . Hypertension Mother   . Scoliosis Mother   . Hypertension Father   . Scoliosis Father   . Hypertension Maternal Grandmother   . Kidney failure Maternal Grandmother   . Heart failure Maternal Grandmother   . Arthritis Maternal Grandmother   . Scoliosis Maternal Grandmother   . Osteoporosis Maternal Grandmother   . Hypertension Maternal Grandfather   . Osteoporosis Maternal Grandfather   . Hypertension Paternal Grandmother   . Osteoporosis Paternal Grandmother   . Arthritis Paternal Grandmother   . Breast cancer Maternal Aunt   . Lung cancer Maternal Aunt     Social History Social History   Tobacco Use  . Smoking status: Current Every Day Smoker    Packs/day: 0.50    Years: 12.00    Pack years: 6.00    Types: Cigarettes  . Smokeless tobacco: Never Used  Vaping Use  . Vaping Use: Never used  Substance Use Topics  . Alcohol use: Yes    Comment: states socially.  . Drug use: Yes    Types: Marijuana     Allergies  Patient has no known allergies.   Review of Systems As stated in HPI otherwise negative   Physical Exam Triage Vital Signs ED Triage Vitals  Enc Vitals Group     BP 09/03/20 1420 (!) 174/116     Pulse Rate 09/03/20 1420 84     Resp 09/03/20 1420 18     Temp 09/03/20 1420 98.1 F (36.7 C)     Temp Source 09/03/20 1420 Oral     SpO2 09/03/20 1420 98 %     Weight --      Height --      Head Circumference --      Peak Flow --      Pain Score 09/03/20 1419 10     Pain Loc --      Pain Edu? --      Excl. in GC? --    No data found.  Updated Vital Signs BP (!) 174/116 (BP Location: Left Arm)   Pulse 84   Temp 98.1 F (36.7 C) (Oral)   Resp 18   LMP 07/29/2020   SpO2 98%   Visual Acuity Right Eye Distance:   Left Eye Distance:   Bilateral Distance:    Right Eye Near:   Left Eye Near:    Bilateral Near:     Physical Exam Constitutional:      General: She is not in acute  distress.    Appearance: Normal appearance. She is normal weight. She is not ill-appearing or toxic-appearing.  HENT:     Head: Normocephalic and atraumatic.  Abdominal:     Tenderness: There is no right CVA tenderness or left CVA tenderness.  Musculoskeletal:        General: No swelling or deformity. Normal range of motion.     Cervical back: Normal range of motion and neck supple.     Comments: Tenderness upon palpation of left lower back in paraspinal region and along SI joint  Skin:    General: Skin is warm and dry.     Findings: No bruising, lesion or rash.  Neurological:     General: No focal deficit present.     Mental Status: She is alert and oriented to person, place, and time.     Motor: No weakness.     Coordination: Coordination normal.     Gait: Gait normal.  Psychiatric:        Mood and Affect: Mood normal.        Behavior: Behavior normal.      UC Treatments / Results  Labs (all labs ordered are listed, but only abnormal results are displayed) Labs Reviewed - No data to display  EKG   Radiology No results found.  Procedures Procedures (including critical care time)  Medications Ordered in UC Medications  ketorolac (TORADOL) injection 60 mg (has no administration in time range)    Initial Impression / Assessment and Plan / UC Course  I have reviewed the triage vital signs and the nursing notes.  Pertinent labs & imaging results that were available during my care of the patient were reviewed by me and considered in my medical decision making (see chart for details).  Low back pain Lumbar strain  -no evidence of fracture or dislocation. Ambulating without assistance -IM toradol in office -Motrin 800 mg every 8 hours as needed.  Patient to stop Celebrex in the meantime and continue prescribed omeprazole.  Continue baclofen as previously prescribed -Frequent heat and light stretching discussed  Reviewed expections re: course  of current medical  issues. Questions answered. Outlined signs and symptoms indicating need for more acute intervention. Pt verbalized understanding. AVS given  Final Clinical Impressions(s) / UC Diagnoses   Final diagnoses:  Strain of lumbar region, initial encounter     Discharge Instructions     Stop taking Celebrex while taking 800 mg Motrin.  Continue already prescribed baclofen as needed.  Light stretching and heat as we discussed.  Return for any weakness or numbness or increasing pain.  She will follow-up with primary doctor regarding blood pressure    ED Prescriptions    Medication Sig Dispense Auth. Provider   ibuprofen (ADVIL) 800 MG tablet Take 1 tablet (800 mg total) by mouth every 8 (eight) hours as needed. 30 tablet Rolla Etienne, NP     PDMP not reviewed this encounter.   Rolla Etienne, NP 09/03/20 1452

## 2020-09-03 NOTE — Discharge Instructions (Addendum)
Stop taking Celebrex while taking 800 mg Motrin.  Continue already prescribed baclofen as needed.  Light stretching and heat as we discussed.  Return for any weakness or numbness or increasing pain.  She will follow-up with primary doctor regarding blood pressure

## 2020-09-03 NOTE — ED Triage Notes (Signed)
Pt presents with lower back pain for over a week; pt states he was hurting before Monday from lifting & pulling at work then she had a fall on Monday in the snow.

## 2021-11-22 ENCOUNTER — Ambulatory Visit: Payer: Medicaid Other

## 2021-11-22 NOTE — Therapy (Incomplete)
?OUTPATIENT PHYSICAL THERAPY CERVICAL EVALUATION ? ? ?Patient Name: Cassandra Nicholson ?MRN: [id number] ?DOB:02/18/1986, 36 y.o., female ?Today's Date: 11/22/2021 ? ? ? ?Past Medical History:  ?Diagnosis Date  ? Asthma   ? as a child  ? Finger fracture, left 12/12/2012  ? left small P2 fx.  ? Headache(784.0)   ? migraines  ? Hypertension   ? no current meds.  ? Mild scoliosis   ? Seasonal allergies   ? ?Past Surgical History:  ?Procedure Laterality Date  ? OPEN REDUCTION INTERNAL FIXATION (ORIF) METACARPAL Left 12/19/2012  ? Procedure: CLOSED REDUCTION PINNING LEFT SMALL P2 FRACTURE ;  Surgeon: Marlowe Shores, MD;  Location: [facility];  Service: Orthopedics;  Laterality: Left;  ? TUBAL LIGATION  02/12/2008  ? ?There are no problems to display for this patient. ? ? ?PCP[facility] ? ?REFERRING PROVIDER: Carmel Sacramento, NP ? ?REFERRING DIAG:  ?cervicalgia ? ?THERAPY DIAG:  ?No diagnosis found. ? ?ONSET DATE: *** ? ?SUBJECTIVE:                                                                                                                                                                                                        ? ?SUBJECTIVE STATEMENT: ?*** ? ?PERTINENT HISTORY:  ?*** ? ?PAIN:  ?Are you having pain? {OPRCPAIN:27236} ? ?PRECAUTIONS: {Therapy precautions:24002} ? ?WEIGHT BEARING RESTRICTIONS {Yes ***/No:24003} ? ?FALLS:  ?Has patient fallen in last 6 months? {yes/no:20286} ?Number of falls: *** ? ?LIVING ENVIRONMENT: ?Lives with: {OPRC lives with:25569::"lives with their family"} ?Lives in: {Lives in:25570} ?Stairs: {opstairs:27293} ?Has following equipment at home: {Assistive devices:23999} ? ?OCCUPATION: *** ? ?PLOF: {PLOF:24004} ? ?PATIENT GOALS *** ? ?OBJECTIVE:  ? ?DIAGNOSTIC FINDINGS:  ?*** ? ?PATIENT SURVEYS:  ?{rehab surveys:24030} ? ? ?COGNITION: ?Overall cognitive status: {cognition:24006} ? ? ?SENSATION: ?{sensation:27233} ? ?POSTURE:   ?*** ? ?PALPATION: ?***  ? ?CERVICAL ROM:  ? ?{AROM/PROM:27142} ROM A/PROM (deg) ?11/22/2021  ?Flexion   ?Extension   ?Right lateral flexion   ?Left lateral flexion   ?Right rotation   ?Left rotation   ? (Blank rows = not tested) ? ?UE ROM: ? ?{AROM/PROM:27142} ROM Right ?11/22/2021 Left ?11/22/2021  ?Shoulder flexion    ?Shoulder extension    ?Shoulder abduction    ?Shoulder adduction    ?Shoulder extension    ?Shoulder internal rotation    ?Shoulder external rotation    ?Elbow flexion    ?Elbow extension    ?Wrist flexion    ?Wrist extension    ?Wrist ulnar deviation    ?Wrist  radial deviation    ?Wrist pronation    ?Wrist supination    ? (Blank rows = not tested) ? ?UE MMT: ? ?MMT Right ?11/22/2021 Left ?11/22/2021  ?Shoulder flexion    ?Shoulder extension    ?Shoulder abduction    ?Shoulder adduction    ?Shoulder extension    ?Shoulder internal rotation    ?Shoulder external rotation    ?Middle trapezius    ?Lower trapezius    ?Elbow flexion    ?Elbow extension    ?Wrist flexion    ?Wrist extension    ?Wrist ulnar deviation    ?Wrist radial deviation    ?Wrist pronation    ?Wrist supination    ?Grip strength    ? (Blank rows = not tested) ? ?CERVICAL SPECIAL TESTS:  ?{Cervical special tests:25246} ? ? ?FUNCTIONAL TESTS:  ?{Functional tests:24029} ? ?PATIENT SURVEYS:  ?{rehab surveys:24030:a} ? ?TODAY'S TREATMENT:  ?*** ? ? ?PATIENT EDUCATION:  ?Education details: *** ?Person educated: {Person educated:25204} ?Education method: {Education Method:25205} ?Education comprehension: {Education Comprehension:25206} ? ? ?HOME EXERCISE PROGRAM: ?*** ? ?ASSESSMENT: ? ?CLINICAL IMPRESSION: ?Patient is a *** y.o. *** who was seen today for physical therapy evaluation and treatment for ***.  ? ? ?OBJECTIVE IMPAIRMENTS {opptimpairments:25111}.  ? ?ACTIVITY LIMITATIONS {activity limitations:25113}.  ? ?PERSONAL FACTORS {Personal factors:25162} are also affecting patient's functional outcome.  ? ? ?REHAB POTENTIAL:  {rehabpotential:25112} ? ?CLINICAL DECISION MAKING: {clinical decision making:25114} ? ?EVALUATION COMPLEXITY: {Evaluation complexity:25115} ? ? ?GOALS: ?Goals reviewed with patient? {yes/no:20286} ? ?SHORT TERM GOALS: Target date: {follow up:25551} ? ?*** ?Baseline: *** ?Goal status: {GOALSTATUS:25110} ? ?2.  *** ?Baseline: *** ?Goal status: {GOALSTATUS:25110} ? ?3.  *** ?Baseline: *** ?Goal status: {GOALSTATUS:25110} ? ?4.  *** ?Baseline: *** ?Goal status: {GOALSTATUS:25110} ? ?5.  *** ?Baseline: *** ?Goal status: {GOALSTATUS:25110} ? ?6.  *** ?Baseline: *** ?Goal status: {GOALSTATUS:25110} ? ?LONG TERM GOALS: Target date: {follow up:25551} ? ?*** ?Baseline: *** ?Goal status: {GOALSTATUS:25110} ? ?2.  *** ?Baseline: *** ?Goal status: {GOALSTATUS:25110} ? ?3.  *** ?Baseline: *** ?Goal status: {GOALSTATUS:25110} ? ?4.  *** ?Baseline: *** ?Goal status: {GOALSTATUS:25110} ? ?5.  *** ?Baseline: *** ?Goal status: {GOALSTATUS:25110} ? ?6.  *** ?Baseline: *** ?Goal status: {GOALSTATUS:25110} ? ? ?PLAN: ?PT FREQUENCY: {rehab frequency:25116} ? ?PT DURATION: {rehab duration:25117} ? ?PLANNED INTERVENTIONS: {rehab planned interventions:25118::"Therapeutic exercises","Therapeutic activity","Neuromuscular re-education","Balance training","Gait training","Patient/Family education","Joint mobilization"} ? ?PLAN FOR NEXT SESSION: *** ? ? ?Eloy End, PT ?11/22/2021, 7:41 AM ? ? ? ?  ?

## 2021-11-23 ENCOUNTER — Ambulatory Visit: Payer: Medicaid Other | Attending: Nurse Practitioner

## 2021-11-23 ENCOUNTER — Other Ambulatory Visit: Payer: Self-pay

## 2021-11-23 DIAGNOSIS — M546 Pain in thoracic spine: Secondary | ICD-10-CM | POA: Diagnosis present

## 2021-11-23 DIAGNOSIS — M542 Cervicalgia: Secondary | ICD-10-CM | POA: Diagnosis present

## 2021-11-23 DIAGNOSIS — M6281 Muscle weakness (generalized): Secondary | ICD-10-CM | POA: Insufficient documentation

## 2021-11-23 NOTE — Therapy (Signed)
?OUTPATIENT PHYSICAL THERAPY CERVICAL EVALUATION ? ? ?Patient Name: Cassandra Nicholson ?MRN: [id number] ?DOB:1986/02/17, 36 y.o., female ?Today's Date: 11/23/2021 ? ? PT End of Session - 11/23/21 1250   ? ? Visit Number 1   ? Number of Visits 17   ? Date for PT Re-Evaluation 01/18/22   ? Authorization Type [facility]   ? Authorization Time Period Pending Auth   ? PT Start Time 1215   ? PT Stop Time 1255   ? PT Time Calculation (min) 40 min   ? Activity Tolerance Patient tolerated treatment well   ? Behavior During Therapy Restless   ? ?  ?  ? ?  ? ? ?Past Medical History:  ?Diagnosis Date  ? Asthma   ? as a child  ? Finger fracture, left 12/12/2012  ? left small P2 fx.  ? Headache(784.0)   ? migraines  ? Hypertension   ? no current meds.  ? Mild scoliosis   ? Seasonal allergies   ? ?Past Surgical History:  ?Procedure Laterality Date  ? OPEN REDUCTION INTERNAL FIXATION (ORIF) METACARPAL Left 12/19/2012  ? Procedure: CLOSED REDUCTION PINNING LEFT SMALL P2 FRACTURE ;  Surgeon: Marlowe Shores, MD;  Location: [facility];  Service: Orthopedics;  Laterality: Left;  ? TUBAL LIGATION  02/12/2008  ? ?There are no problems to display for this patient. ? ? ?PCP[facility] ? ?REFERRING PROVIDER: Carmel Sacramento, NP ? ?REFERRING DIAG: cervicalgia ? ?THERAPY DIAG:  ?Neck pain ? ?Muscle weakness (generalized) ? ?Pain in thoracic spine ? ?ONSET DATE: 3 years ago ? ?SUBJECTIVE:                                                                                                                                                                                                        ? ?SUBJECTIVE STATEMENT: ?Pt reports primary c/o Lt sided lower cervical/ upper thoracic pain, as well as Lt scapular pain that she says radiates up to her upper trap. Her pain is of insidious onset and has lasted for three years, getting worse over the past few months. Pt denies any N/T into the arm. She describes the  pain as a dull, achy pain that lasts all day. 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 Current pain is 8/10. Worst pain is 10/10. Best pain is 0/10. Aggravating factors include standing >2.5 hours, bending over. Easing factors include laying supine, ibuprofen, blue emu cream, and ice packs. ? ?PERTINENT HISTORY:  ?Asthma, HTN ? ?  PAIN:  ?Are you having pain? Yes: NPRS scale: 8/10 ?Pain location: Lt upper trap, scapular region ?Pain description: dull ache ?Aggravating factors: standing >2.5 hours, bending over ?Relieving factors: laying supine, ibuprofen, blue emu cream, and ice packs ? ?PRECAUTIONS: None ? ?WEIGHT BEARING RESTRICTIONS No ? ?FALLS:  ?Has patient fallen in last 6 months? No ? ?LIVING ENVIRONMENT: ?Lives with: lives with their family ?Lives in: House/apartment ?Stairs: No ?Has following equipment at home: None ? ?OCCUPATION: Works in Clinical research associatedeli at [employer] ? ?PLOF: Independent ? ?PATIENT GOALS Return to working and exercising with less pain ? ?OBJECTIVE:  ? ?DIAGNOSTIC FINDINGS:  ?None current ? ?PATIENT SURVEYS:  ?NDI 23/50, 46% ? ? ?COGNITION: ?Overall cognitive status: Within functional limits for tasks assessed ? ? ?SENSATION: ?WFL ? ?POSTURE:  ?Forward head, rounded shoulders ? ?PALPATION: ?TTP to Lt thoracic paraspinals and mid/ upper traps with palpable trigger points; tight BIL UT ? ?PASSIVE ACCESSORIES: ?Hypomobile and painful CPAs and Lt UPAs C6-T8  ? ?CERVICAL ROM:  ? ?Active ROM AROM (deg) ?11/23/2021  ?Flexion 45p!  ?Extension 80  ?Right lateral flexion 45p!  ?Left lateral flexion 50  ?Right rotation 62, pulling  ?Left rotation 70  ? (Blank rows = not tested) ? ? ? ?UE MMT: ? ?MMT Right ?11/23/2021 Left ?11/23/2021  ?Shoulder flexion 4/5 4/5p!  ?Shoulder abduction 4/5 4/5p!  ?Shoulder ER 5/5 5/5  ?Middle trapezius 5/5 4/5  ?Lower trapezius 4/5 2+/5p!  ?Latissimus dorsi 5/5 3+/5  ? (Blank rows = not tested) ? ?CERVICAL SPECIAL TESTS:  ?Cervical distraction:  (-) ?Cervical compression: (-) ?Spurling's: (-) ?CRLF: (-) ?Quadrant testing: (+) to Lt ? ? ?FUNCTIONAL TESTS:  ?Supine DNF endurance test: perform treatment one* ? ? ?TODAY'S TREATMENT:  ?[facility]:                                                DATE: 11/23/2021 ?Therapeutic Exercise: ?N/A ?Manual Therapy: ?Prone CT junction grade V manipulation x1 BIL with cavitation ?Prone thoracic grade V manipulation x4 throughout spine with multiple cavitations ?Neuromuscular re-ed: ?N/A ?Therapeutic Activity: ?Demonstrated and issued HEP ?Modalities: ?N/A ?Self Care: ?N/A ? ? ? ?PATIENT EDUCATION:  ?Education details: Pt educated on probable underlying pathophysiology leading to pain presentation, prognosis, POC, NDI, HEP ?Person educated: Patient ?Education method: Explanation, Demonstration, and Handouts ?Education comprehension: verbalized understanding and returned demonstration ? ? ?HOME EXERCISE PROGRAM: ?Access Code: [email or web address] ?URL: [email or web address] ?Date: 11/23/2021 ?Prepared by: Carmelina Daneucker Annelisa Ryback ? ?Exercises ?- Standing Shoulder Row with Anchored Resistance  - 1 x daily - 7 x weekly - 3 sets - 10 reps - 3-sec hold ?- Shoulder extension with resistance - Neutral  - 1 x daily - 7 x weekly - 3 sets - 10 reps - 3-sec hold ?- Seated Cervical Retraction  - 1 x daily - 7 x weekly - 3 sets - 10 reps - 3-sec hold ?- Seated Cervical Sidebending Stretch  - 1 x daily - 7 x weekly - 2-min hold ? ?ASSESSMENT: ? ?CLINICAL IMPRESSION: ?Patient is a 36 y.o. 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  Upon assessment, the pt's primary impairments include painful and limited cervical flexion, Rt rotation, and Rt side bend AROM, TTP to Lt thoracic paraspinals and mid/ upper traps with palpable trigger points, tight  BIL upper traps, hypomobile and painful lower cervical and thoracic CPAs and Lt UPAs, and weak  Lt shoulder and parascapular strength. Ruling up cervicothoracic facet dysfunction due to unilateral pain presentation, unilaterally impaired passive accessories, positive cervical quadrant testing, unilateral TTP with palpable trigger points, and report of pain radiating up the spine from the shoulder blades. Pt responded well to OMT today, reporting improved pain and demonstrating improved cervicothoracic passive accessory mobility following interventions. She will benefit from skilled PT to address her primary impairments and return to her prior level of function with less limitation. ? ?OBJECTIVE IMPAIRMENTS decreased ROM, decreased strength, hypomobility, impaired flexibility, impaired UE functional use, improper body mechanics, postural dysfunction, and pain.  ? ?ACTIVITY LIMITATIONS cleaning, community activity, driving, occupation, laundry, yard work, and shopping.  ? ?PERSONAL FACTORS 1-2 comorbidities: asthma, HTN  are also affecting patient's functional outcome.  ? ? ?REHAB POTENTIAL: Good ? ?CLINICAL DECISION MAKING: Stable/uncomplicated ? ?EVALUATION COMPLEXITY: Low ? ? ?GOALS: ?Goals reviewed with patient? Yes ? ?SHORT TERM GOALS: Target date: 12/21/2021 ? ?Pt will report understanding and adherence to her HEP in order to promote independence in the management of her primary impairments. ?Baseline: HEP provided at eval ?Goal status: INITIAL ? ? ?LONG TERM GOALS: Target date: 01/18/2022 ? ?Pt will achieve an NDI score of 10/50 (20%) in order to demonstrate improved functional ability as it relates to her cervicothoracic pain. ?Baseline: 23/50, 46% ?Goal status: INITIAL ? ?2.  Pt will report ability to stand throughout her workday with 0-3/10 pain. ?Baseline: >8/10 pain after 2.5 hours ?Goal status: INITIAL ? ?3.  Pt will achieve WNL cervical AROM in all planes with 0-3/10 pain in order to get dressed without limitation. ?Baseline: See AROM chart ?Goal status: INITIAL ? ?4.  Pt will achieve BIL global  parascapular strength of 4+/5 or greater in order to prevent future exacerbations of mechanical cervicothoracic pain. ?Baseline: See MMT chart ?Goal status: INITIAL ? ? ?PLAN: ?PT FREQUENCY: 2x/week ? ?PT DURATION: 8

## 2021-12-01 NOTE — Therapy (Deleted)
?OUTPATIENT PHYSICAL THERAPY TREATMENT NOTE ? ? ?Patient Name: Cassandra Nicholson ?MRN: [id number] ?DOB:1985/10/06, 36 y.o., female ?Today's Date: 12/01/2021 ? ?PCP: [facility] ?REFERRING PROVIDER: [facility] ? ? ? ?Past Medical History:  ?Diagnosis Date  ? Asthma   ? as a child  ? Finger fracture, left 12/12/2012  ? left small P2 fx.  ? Headache(784.0)   ? migraines  ? Hypertension   ? no current meds.  ? Mild scoliosis   ? Seasonal allergies   ? ?Past Surgical History:  ?Procedure Laterality Date  ? OPEN REDUCTION INTERNAL FIXATION (ORIF) METACARPAL Left 12/19/2012  ? Procedure: CLOSED REDUCTION PINNING LEFT SMALL P2 FRACTURE ;  Surgeon: Marlowe Shores, MD;  Location: [facility];  Service: Orthopedics;  Laterality: Left;  ? TUBAL LIGATION  02/12/2008  ? ?There are no problems to display for this patient. ? ? ?THERAPY DIAG:  ?No diagnosis found. ? ?REFERRING DIAG: cervicalgia ? ?PERTINENT HISTORY: Asthma, HTN ? ?PRECAUTIONS/RESTRICTIONS:  ? ?none ? ?SUBJECTIVE:  ?*** ? ?Are you having pain? Yes: NPRS scale: 8/10 ?Pain location: Lt upper trap, scapular region ?Pain description: dull ache ?Aggravating factors: standing >2.5 hours, bending over ?Relieving factors: laying supine, ibuprofen, blue emu cream, and ice packs ? ?OBJECTIVE: ? ?DIAGNOSTIC FINDINGS:  ?None current ?  ?PATIENT SURVEYS:  ?NDI 23/50, 46% ?  ?  ?COGNITION: ?Overall cognitive status: Within functional limits for tasks assessed ?  ?  ?SENSATION: ?WFL ?  ?POSTURE:  ?Forward head, rounded shoulders ?  ?PALPATION: ?TTP to Lt thoracic paraspinals and mid/ upper traps with palpable trigger points; tight BIL UT ?  ?PASSIVE ACCESSORIES: ?Hypomobile and painful CPAs and Lt UPAs C6-T8        ?  ?CERVICAL ROM:  ?  ?Active ROM AROM (deg) ?11/23/2021  ?Flexion 45p!  ?Extension 80  ?Right lateral flexion 45p!  ?Left lateral flexion 50  ?Right rotation 62, pulling  ?Left rotation 70  ? (Blank rows = not  tested) ?  ?  ?  ?UE MMT: ?  ?MMT Right ?11/23/2021 Left ?11/23/2021  ?Shoulder flexion 4/5 4/5p!  ?Shoulder abduction 4/5 4/5p!  ?Shoulder ER 5/5 5/5  ?Middle trapezius 5/5 4/5  ?Lower trapezius 4/5 2+/5p!  ?Latissimus dorsi 5/5 3+/5  ? (Blank rows = not tested) ?  ?CERVICAL SPECIAL TESTS:  ?Cervical distraction: (-) ?Cervical compression: (-) ?Spurling's: (-) ?CRLF: (-) ?Quadrant testing: (+) to Lt ?  ?  ?FUNCTIONAL TESTS:  ?Supine DNF endurance test: perform treatment one* ?  ?  ?  ?HOME EXERCISE PROGRAM: ?Access Code: [email or web address] ?URL: [email or web address] ?Date: 11/23/2021 ?Prepared by: Carmelina Dane ?  ?Exercises ?- Standing Shoulder Row with Anchored Resistance  - 1 x daily - 7 x weekly - 3 sets - 10 reps - 3-sec hold ?- Shoulder extension with resistance - Neutral  - 1 x daily - 7 x weekly - 3 sets - 10 reps - 3-sec hold ?- Seated Cervical Retraction  - 1 x daily - 7 x weekly - 3 sets - 10 reps - 3-sec hold ?- Seated Cervical Sidebending Stretch  - 1 x daily - 7 x weekly - 2-min hold ? ? ?TREATMENT ***: ? ?Therapeutic Exercise: ?- DNF endurance test ? ?Manual Therapy: ?- *** ? ?Neuromuscular re-ed: ?- *** ? ?Therapeutic Activity: ?- *** ? ?Self-care/Home Management: ?- *** ? ?ASSESSMENT: ?  ?CLINICAL IMPRESSION: ?Patient is a 36 y.o. 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  Upon assessment, the pt's primary impairments include painful and limited cervical flexion, Rt rotation, and Rt side bend AROM, TTP to Lt thoracic paraspinals and mid/ upper traps with palpable trigger points, tight BIL upper traps, hypomobile and painful lower cervical and thoracic CPAs and Lt UPAs, and weak Lt shoulder and parascapular strength. Ruling up cervicothoracic facet dysfunction due to unilateral pain presentation, unilaterally impaired passive accessories, positive cervical quadrant testing, unilateral TTP with  palpable trigger points, and report of pain radiating up the spine from the shoulder blades. Pt responded well to OMT today, reporting improved pain and demonstrating improved cervicothoracic passive accessory mobility following interventions. She will benefit from skilled PT to address her primary impairments and return to her prior level of function with less limitation. ?  ?OBJECTIVE IMPAIRMENTS decreased ROM, decreased strength, hypomobility, impaired flexibility, impaired UE functional use, improper body mechanics, postural dysfunction, and pain.  ?  ?ACTIVITY LIMITATIONS cleaning, community activity, driving, occupation, laundry, yard work, and shopping.  ?  ?PERSONAL FACTORS 1-2 comorbidities: asthma, HTN  are also affecting patient's functional outcome.  ?  ?  ?REHAB POTENTIAL: Good ?  ?CLINICAL DECISION MAKING: Stable/uncomplicated ?  ?EVALUATION COMPLEXITY: Low ?  ?  ?GOALS: ?Goals reviewed with patient? Yes ?  ?SHORT TERM GOALS: Target date: 12/21/2021 ?  ?Pt will report understanding and adherence to her HEP in order to promote independence in the management of her primary impairments. ?Baseline: HEP provided at eval ?Goal status: INITIAL ?  ?  ?LONG TERM GOALS: Target date: 01/18/2022 ?  ?Pt will achieve an NDI score of 10/50 (20%) in order to demonstrate improved functional ability as it relates to her cervicothoracic pain. ?Baseline: 23/50, 46% ?Goal status: INITIAL ?  ?2.  Pt will report ability to stand throughout her workday with 0-3/10 pain. ?Baseline: >8/10 pain after 2.5 hours ?Goal status: INITIAL ?  ?3.  Pt will achieve WNL cervical AROM in all planes with 0-3/10 pain in order to get dressed without limitation. ?Baseline: See AROM chart ?Goal status: INITIAL ?  ?4.  Pt will achieve BIL global parascapular strength of 4+/5 or greater in order to prevent future exacerbations of mechanical cervicothoracic pain. ?Baseline: See MMT chart ?Goal status: INITIAL ?  ?  ?PLAN: ?PT FREQUENCY: 2x/week ?   ?PT DURATION: 8 weeks ?  ?PLANNED INTERVENTIONS: Therapeutic exercises, Therapeutic activity, Neuromuscular re-education, Patient/Family education, Joint manipulation, Joint mobilization, Dry Needling, Electrical stimulation, Spinal manipulation, Spinal mobilization, Cryotherapy, Moist heat, Taping, Vasopneumatic device, Traction, and Manual therapy ?  ?PLAN FOR NEXT SESSION: Progress parascapular/ DNF strengthening, assess DNF endurance test, manual techniques/ OMT vs TPDN ? ? ?Fredderick Phenix PT ?12/01/2021, 5:12 PM ? ?  ? ?

## 2021-12-02 ENCOUNTER — Ambulatory Visit: Payer: Medicaid Other | Admitting: Physical Therapy

## 2021-12-16 ENCOUNTER — Ambulatory Visit: Payer: Medicaid Other | Attending: Nurse Practitioner

## 2021-12-16 NOTE — Therapy (Incomplete)
?OUTPATIENT PHYSICAL THERAPY TREATMENT NOTE ? ? ?Patient Name: Cassandra Nicholson ?MRN: [id number] ?DOB:01/06/1986, 36 y.o., female ?Today's Date: 12/16/2021 ? ?PCP: [facility] ?REFERRING PROVIDER: Emelia Loron, NP ? ?END OF SESSION:  ? ? ?Past Medical History:  ?Diagnosis Date  ? Asthma   ? as a child  ? Finger fracture, left 12/12/2012  ? left small P2 fx.  ? Headache(784.0)   ? migraines  ? Hypertension   ? no current meds.  ? Mild scoliosis   ? Seasonal allergies   ? ?Past Surgical History:  ?Procedure Laterality Date  ? OPEN REDUCTION INTERNAL FIXATION (ORIF) METACARPAL Left 12/19/2012  ? Procedure: CLOSED REDUCTION PINNING LEFT SMALL P2 FRACTURE ;  Surgeon: Schuyler Amor, MD;  Location: [place];  Service: Orthopedics;  Laterality: Left;  ? TUBAL LIGATION  02/12/2008  ? ?There are no problems to display for this patient. ? ? ?REFERRING DIAG: *** ? ?THERAPY DIAG:  ?No diagnosis found. ? ?PERTINENT HISTORY: *** ? ?PRECAUTIONS: *** ? ?SUBJECTIVE: *** ? ?PAIN:  ?Are you having pain? {OPRCPAIN:27236} ? ? ?OBJECTIVE: (objective measures completed at initial evaluation unless otherwise dated) ? ?DIAGNOSTIC FINDINGS:  ?None current ?  ?PATIENT SURVEYS:  ?NDI 23/50, 46% ?  ?  ?COGNITION: ?Overall cognitive status: Within functional limits for tasks assessed ?  ?  ?SENSATION: ?WFL ?  ?POSTURE:  ?Forward head, rounded shoulders ?  ?PALPATION: ?TTP to Lt thoracic paraspinals and mid/ upper traps with palpable trigger points; tight BIL UT ?  ?PASSIVE ACCESSORIES: ?Hypomobile and painful CPAs and Lt UPAs C6-T8        ?  ?CERVICAL ROM:  ?  ?Active ROM AROM (deg) ?11/23/2021  ?Flexion 45p!  ?Extension 80  ?Right lateral flexion 45p!  ?Left lateral flexion 50  ?Right rotation 62, pulling  ?Left rotation 70  ? (Blank rows = not tested) ?  ?  ?  ?UE MMT: ?  ?MMT Right ?11/23/2021 Left ?11/23/2021  ?Shoulder flexion 4/5 4/5p!  ?Shoulder abduction 4/5 4/5p!  ?Shoulder ER 5/5 5/5  ?Middle  trapezius 5/5 4/5  ?Lower trapezius 4/5 2+/5p!  ?Latissimus dorsi 5/5 3+/5  ? (Blank rows = not tested) ?  ?CERVICAL SPECIAL TESTS:  ?Cervical distraction: (-) ?Cervical compression: (-) ?Spurling's: (-) ?CRLF: (-) ?Quadrant testing: (+) to Lt ?  ?  ?FUNCTIONAL TESTS:  ?Supine DNF endurance test: perform treatment one* ?  ?  ?TODAY'S TREATMENT:  ?[facility] Treatment: DATE: 12/16/2021 ?Therapeutic Exercise: ?*** ?Manual Therapy: ?*** ?Neuromuscular re-ed: ?*** ?Therapeutic Activity: ?*** ?Modalities: ?*** ?Self Care: ?*** ? ? ?[facility] Treatment: DATE: 11/23/2021 ?Manual Therapy: ?Prone CT junction grade V manipulation x1 BIL with cavitation ?Prone thoracic grade V manipulation x4 throughout spine with multiple cavitations ?Therapeutic Activity: ?Demonstrated and issued HEP ?  ?  ?PATIENT EDUCATION:  ?Education details: Pt educated on probable underlying pathophysiology leading to pain presentation, prognosis, POC, NDI, HEP ?Person educated: Patient ?Education method: Explanation, Demonstration, and Handouts ?Education comprehension: verbalized understanding and returned demonstration ?  ?  ?HOME EXERCISE PROGRAM: ?Access Code: [email or web address] ?URL: [email or web address] ?Date: 11/23/2021 ?Prepared by: Vanessa Valliant ?  ?Exercises ?- Standing Shoulder Row with Anchored Resistance  - 1 x daily - 7 x weekly - 3 sets - 10 reps - 3-sec hold ?- Shoulder extension with resistance - Neutral  - 1 x daily - 7 x weekly - 3 sets - 10 reps - 3-sec hold ?- Seated Cervical Retraction  - 1 x daily -  7 x weekly - 3 sets - 10 reps - 3-sec hold ?- Seated Cervical Sidebending Stretch  - 1 x daily - 7 x weekly - 2-min hold ?  ?ASSESSMENT: ?  ?CLINICAL IMPRESSION: ?*** ?  ?OBJECTIVE IMPAIRMENTS decreased ROM, decreased strength, hypomobility, impaired flexibility, impaired UE functional use, improper body mechanics, postural dysfunction, and pain.  ?  ?ACTIVITY LIMITATIONS cleaning, community activity, driving,  occupation, laundry, yard work, and shopping.  ?  ?PERSONAL FACTORS 1-2 comorbidities: asthma, HTN  are also affecting patient's functional outcome.  ?  ?  ?GOALS: ?Goals reviewed with patient? Yes ?  ?SHORT TERM GOALS: Target date: 12/21/2021 ?  ?Pt will report understanding and adherence to her HEP in order to promote independence in the management of her primary impairments. ?Baseline: HEP provided at eval ?Goal status: INITIAL ?  ?  ?LONG TERM GOALS: Target date: 01/18/2022 ?  ?Pt will achieve an NDI score of 10/50 (20%) in order to demonstrate improved functional ability as it relates to her cervicothoracic pain. ?Baseline: 23/50, 46% ?Goal status: INITIAL ?  ?2.  Pt will report ability to stand throughout her workday with 0-3/10 pain. ?Baseline: >8/10 pain after 2.5 hours ?Goal status: INITIAL ?  ?3.  Pt will achieve WNL cervical AROM in all planes with 0-3/10 pain in order to get dressed without limitation. ?Baseline: See AROM chart ?Goal status: INITIAL ?  ?4.  Pt will achieve BIL global parascapular strength of 4+/5 or greater in order to prevent future exacerbations of mechanical cervicothoracic pain. ?Baseline: See MMT chart ?Goal status: INITIAL ?  ?  ?PLAN: ?PT FREQUENCY: 2x/week ?  ?PT DURATION: 8 weeks ?  ?PLANNED INTERVENTIONS: Therapeutic exercises, Therapeutic activity, Neuromuscular re-education, Patient/Family education, Joint manipulation, Joint mobilization, Dry Needling, Electrical stimulation, Spinal manipulation, Spinal mobilization, Cryotherapy, Moist heat, Taping, Vasopneumatic device, Traction, and Manual therapy ?  ?PLAN FOR NEXT SESSION: Progress parascapular/ DNF strengthening, assess DNF endurance test, manual techniques/ OMT vs TPDN ? ? ? ?Ward Chatters, PT ?12/16/2021, 1:42 PM ? ?   ?

## 2022-01-20 ENCOUNTER — Ambulatory Visit: Payer: Medicaid Other

## 2022-01-20 NOTE — Therapy (Incomplete)
OUTPATIENT PHYSICAL THERAPY TREATMENT NOTE   Patient Name: Cassandra Nicholson MRN: [id number] DOB:1986/04/19, 36 y.o., female Today's Date: 01/20/2022  PCP: Marland Kitchen REFERRING PROVIDER: ***  END OF SESSION:    Past Medical History:  Diagnosis Date   Asthma    as a child   Finger fracture, left 12/12/2012   left small P2 fx.   Headache(784.0)    migraines   Hypertension    no current meds.   Mild scoliosis    Seasonal allergies    Past Surgical History:  Procedure Laterality Date   OPEN REDUCTION INTERNAL FIXATION (ORIF) METACARPAL Left 12/19/2012   Procedure: CLOSED REDUCTION PINNING LEFT SMALL P2 FRACTURE ;  Surgeon: Marlowe Shores, MD;  Location: [facility];  Service: Orthopedics;  Laterality: Left;   TUBAL LIGATION  02/12/2008   There are no problems to display for this patient.   REFERRING DIAG: ***  THERAPY DIAG:  No diagnosis found.  Rationale for Evaluation and Treatment {HABREHAB:27488}  PERTINENT HISTORY: ***  PRECAUTIONS: ***  SUBJECTIVE: ***  PAIN:  Are you having pain? {OPRCPAIN:27236}   OBJECTIVE: (objective measures completed at initial evaluation unless otherwise dated)   (Copy Eval's Objective through Plan section here) SUBJECTIVE:                                                                                                                                                                                                          SUBJECTIVE STATEMENT: Pt reports primary c/o Lt sided lower cervical/ upper thoracic pain, as well as Lt scapular pain that she says radiates up to her upper trap. Her pain is of insidious onset and has lasted for three years, getting worse over the past few months. Pt denies any N/T into the arm. She describes the pain as a dull, achy pain that lasts all day. 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 Current pain is 8/10. Worst pain is 10/10. Best pain is 0/10. Aggravating  factors include standing >2.5 hours, bending over. Easing factors include laying supine, ibuprofen, blue emu cream, and ice packs.   PERTINENT HISTORY:  Asthma, HTN   PAIN:  Are you having pain? Yes: NPRS scale: 8/10 Pain location: Lt upper trap, scapular region Pain description: dull ache Aggravating factors: standing >2.5 hours, bending over Relieving factors: laying supine, ibuprofen, blue emu cream, and ice packs   PRECAUTIONS: None   WEIGHT BEARING RESTRICTIONS No   FALLS:  Has patient fallen in last 6 months? No  LIVING ENVIRONMENT: Lives with: lives with their family Lives in: House/apartment Stairs: No Has following equipment at home: None   OCCUPATION: Works in Clinical research associate at [employer]   PLOF: Independent   PATIENT GOALS Return to working and exercising with less pain   OBJECTIVE:    DIAGNOSTIC FINDINGS:  None current   PATIENT SURVEYS:  NDI 23/50, 46%     COGNITION: Overall cognitive status: Within functional limits for tasks assessed     SENSATION: WFL   POSTURE:  Forward head, rounded shoulders   PALPATION: TTP to Lt thoracic paraspinals and mid/ upper traps with palpable trigger points; tight BIL UT   PASSIVE ACCESSORIES: Hypomobile and painful CPAs and Lt UPAs C6-T8          CERVICAL ROM:    Active ROM AROM (deg) 11/23/2021  Flexion 45p!  Extension 80  Right lateral flexion 45p!  Left lateral flexion 50  Right rotation 62, pulling  Left rotation 70   (Blank rows = not tested)       UE MMT:   MMT Right 11/23/2021 Left 11/23/2021  Shoulder flexion 4/5 4/5p!  Shoulder abduction 4/5 4/5p!  Shoulder ER 5/5 5/5  Middle trapezius 5/5 4/5  Lower trapezius 4/5 2+/5p!  Latissimus dorsi 5/5 3+/5   (Blank rows = not tested)   CERVICAL SPECIAL TESTS:  Cervical distraction: (-) Cervical compression: (-) Spurling's: (-) CRLF: (-) Quadrant testing: (+) to Lt     FUNCTIONAL TESTS:  Supine DNF endurance test: perform treatment one*      TODAY'S TREATMENT:  [facility] PT Treatment:                                                DATE: 11/23/2021 Therapeutic Exercise: N/A Manual Therapy: Prone CT junction grade V manipulation x1 BIL with cavitation Prone thoracic grade V manipulation x4 throughout spine with multiple cavitations Neuromuscular re-ed: N/A Therapeutic Activity: Demonstrated and issued HEP Modalities: N/A Self Care: N/A       PATIENT EDUCATION:  Education details: Pt educated on probable underlying pathophysiology leading to pain presentation, prognosis, POC, NDI, HEP Person educated: Patient Education method: Explanation, Demonstration, and Handouts Education comprehension: verbalized understanding and returned demonstration     HOME EXERCISE PROGRAM: Access Code: [email or web address] URL: [email or web address] Date: 11/23/2021 Prepared by: Carmelina Dane   Exercises - Standing Shoulder Row with Anchored Resistance  - 1 x daily - 7 x weekly - 3 sets - 10 reps - 3-sec hold - Shoulder extension with resistance - Neutral  - 1 x daily - 7 x weekly - 3 sets - 10 reps - 3-sec hold - Seated Cervical Retraction  - 1 x daily - 7 x weekly - 3 sets - 10 reps - 3-sec hold - Seated Cervical Sidebending Stretch  - 1 x daily - 7 x weekly - 2-min hold   ASSESSMENT:   CLINICAL IMPRESSION: Patient is a 36 y.o. 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  Upon assessment, the pt's primary impairments include painful and limited cervical flexion, Rt rotation, and Rt side bend AROM, TTP to Lt thoracic paraspinals and mid/ upper traps with palpable trigger points, tight BIL upper traps, hypomobile and painful lower cervical and thoracic CPAs and Lt UPAs, and weak Lt shoulder and parascapular strength.  Ruling up cervicothoracic facet dysfunction due to unilateral pain presentation, unilaterally impaired passive  accessories, positive cervical quadrant testing, unilateral TTP with palpable trigger points, and report of pain radiating up the spine from the shoulder blades. Pt responded well to OMT today, reporting improved pain and demonstrating improved cervicothoracic passive accessory mobility following interventions. She will benefit from skilled PT to address her primary impairments and return to her prior level of function with less limitation.   OBJECTIVE IMPAIRMENTS decreased ROM, decreased strength, hypomobility, impaired flexibility, impaired UE functional use, improper body mechanics, postural dysfunction, and pain.    ACTIVITY LIMITATIONS cleaning, community activity, driving, occupation, Pharmacologistlaundry, yard work, and shopping.    PERSONAL FACTORS 1-2 comorbidities: asthma, HTN  are also affecting patient's functional outcome.      REHAB POTENTIAL: Good   CLINICAL DECISION MAKING: Stable/uncomplicated   EVALUATION COMPLEXITY: Low     GOALS: Goals reviewed with patient? Yes   SHORT TERM GOALS: Target date: 12/21/2021   Pt will report understanding and adherence to her HEP in order to promote independence in the management of her primary impairments. Baseline: HEP provided at eval Goal status: INITIAL     LONG TERM GOALS: Target date: 01/18/2022   Pt will achieve an NDI score of 10/50 (20%) in order to demonstrate improved functional ability as it relates to her cervicothoracic pain. Baseline: 23/50, 46% Goal status: INITIAL   2.  Pt will report ability to stand throughout her workday with 0-3/10 pain. Baseline: >8/10 pain after 2.5 hours Goal status: INITIAL   3.  Pt will achieve WNL cervical AROM in all planes with 0-3/10 pain in order to get dressed without limitation. Baseline: See AROM chart Goal status: INITIAL   4.  Pt will achieve BIL global parascapular strength of 4+/5 or greater in order to prevent future exacerbations of mechanical cervicothoracic pain. Baseline: See MMT  chart Goal status: INITIAL     PLAN: PT FREQUENCY: 2x/week   PT DURATION: 8 weeks   PLANNED INTERVENTIONS: Therapeutic exercises, Therapeutic activity, Neuromuscular re-education, Patient/Family education, Joint manipulation, Joint mobilization, Dry Needling, Electrical stimulation, Spinal manipulation, Spinal mobilization, Cryotherapy, Moist heat, Taping, Vasopneumatic device, Traction, and Manual therapy   PLAN FOR NEXT SESSION: Progress parascapular/ DNF strengthening, assess DNF endurance test, manual techniques/ OMT vs TPDN     Carmelina DaneYarborough, Tucker, PT, DPT 11/23/21 1:54 PM   [employer] Authorization    Choose one: Rehabilitative   Standardized Assessment or Functional Outcome Tool: See Pain Assessment and NDI   Score or Percent Disability: 23/50, 46%   Body Parts Treated (Select each separately):  Cervicothoracic. Overall deficits/functional limitations for body part selected: moderate Shoulder. Overall deficits/functional limitations for body part selected: mild N/A.      If treatment provided at initial evaluation, no treatment charged due to lack of authorization   [employer]  Adarrius Graeff, PT 01/20/2022, 5:38 AM

## 2022-02-08 NOTE — Therapy (Incomplete)
OUTPATIENT PHYSICAL THERAPY TREATMENT NOTE   Patient Name: Cassandra Nicholson M Tvedt MRN: [id number] DOB:08/06/1986, 36 y.o., female Today's Date: 02/08/2022  PCP: Marland Kitchen*** REFERRING PROVIDER: ***  END OF SESSION:    Past Medical History:  Diagnosis Date   Asthma    as a child   Finger fracture, left 12/12/2012   left small P2 fx.   Headache(784.0)    migraines   Hypertension    no current meds.   Mild scoliosis    Seasonal allergies    Past Surgical History:  Procedure Laterality Date   OPEN REDUCTION INTERNAL FIXATION (ORIF) METACARPAL Left 12/19/2012   Procedure: CLOSED REDUCTION PINNING LEFT SMALL P2 FRACTURE ;  Surgeon: Marlowe ShoresMatthew A Weingold, MD;  Location: [facility];  Service: Orthopedics;  Laterality: Left;   TUBAL LIGATION  02/12/2008   There are no problems to display for this patient.   REFERRING DIAG: ***  THERAPY DIAG:  No diagnosis found.  Rationale for Evaluation and Treatment {HABREHAB:27488}  PERTINENT HISTORY: ***  PRECAUTIONS: ***  SUBJECTIVE: ***  PAIN:  Are you having pain? {OPRCPAIN:27236}   OBJECTIVE: (objective measures completed at initial evaluation unless otherwise dated)   (Copy Eval's Objective through Plan section here) SUBJECTIVE:                                                                                                                                                                                                          SUBJECTIVE STATEMENT: Pt reports primary c/o Lt sided lower cervical/ upper thoracic pain, as well as Lt scapular pain that she says radiates up to her upper trap. Her pain is of insidious onset and has lasted for three years, getting worse over the past few months. Pt denies any N/T into the arm. She describes the pain as a dull, achy pain that lasts all day. 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 Current pain is 8/10. Worst pain is 10/10. Best pain is 0/10. Aggravating  factors include standing >2.5 hours, bending over. Easing factors include laying supine, ibuprofen, blue emu cream, and ice packs.   PERTINENT HISTORY:  Asthma, HTN   PAIN:  Are you having pain? Yes: NPRS scale: 8/10 Pain location: Lt upper trap, scapular region Pain description: dull ache Aggravating factors: standing >2.5 hours, bending over Relieving factors: laying supine, ibuprofen, blue emu cream, and ice packs   PRECAUTIONS: None   WEIGHT BEARING RESTRICTIONS No   FALLS:  Has patient fallen in last 6 months? No  LIVING ENVIRONMENT: Lives with: lives with their family Lives in: House/apartment Stairs: No Has following equipment at home: None   OCCUPATION: Works in Clinical research associate at [employer]   PLOF: Independent   PATIENT GOALS Return to working and exercising with less pain   OBJECTIVE:    DIAGNOSTIC FINDINGS:  None current   PATIENT SURVEYS:  NDI 23/50, 46%     COGNITION: Overall cognitive status: Within functional limits for tasks assessed     SENSATION: WFL   POSTURE:  Forward head, rounded shoulders   PALPATION: TTP to Lt thoracic paraspinals and mid/ upper traps with palpable trigger points; tight BIL UT   PASSIVE ACCESSORIES: Hypomobile and painful CPAs and Lt UPAs C6-T8          CERVICAL ROM:    Active ROM AROM (deg) 11/23/2021  Flexion 45p!  Extension 80  Right lateral flexion 45p!  Left lateral flexion 50  Right rotation 62, pulling  Left rotation 70   (Blank rows = not tested)       UE MMT:   MMT Right 11/23/2021 Left 11/23/2021  Shoulder flexion 4/5 4/5p!  Shoulder abduction 4/5 4/5p!  Shoulder ER 5/5 5/5  Middle trapezius 5/5 4/5  Lower trapezius 4/5 2+/5p!  Latissimus dorsi 5/5 3+/5   (Blank rows = not tested)   CERVICAL SPECIAL TESTS:  Cervical distraction: (-) Cervical compression: (-) Spurling's: (-) CRLF: (-) Quadrant testing: (+) to Lt     FUNCTIONAL TESTS:  Supine DNF endurance test: perform treatment one*      TODAY'S TREATMENT:   [facility] Adult PT Treatment:                                                DATE: 02/09/22 Therapeutic Exercise: *** Manual Therapy: *** Neuromuscular re-ed: *** Therapeutic Activity: *** Modalities: *** Self Care: ***  [facility] Adult PT Treatment:                                                DATE: 11/23/2021 Therapeutic Exercise: N/A Manual Therapy: Prone CT junction grade V manipulation x1 BIL with cavitation Prone thoracic grade V manipulation x4 throughout spine with multiple cavitations Neuromuscular re-ed: N/A Therapeutic Activity: Demonstrated and issued HEP Modalities: N/A Self Care: N/A       PATIENT EDUCATION:  Education details: Pt educated on probable underlying pathophysiology leading to pain presentation, prognosis, POC, NDI, HEP Person educated: Patient Education method: Explanation, Demonstration, and Handouts Education comprehension: verbalized understanding and returned demonstration     HOME EXERCISE PROGRAM: Access Code: [email or web address] URL: [email or web address] Date: 11/23/2021 Prepared by: Carmelina Dane   Exercises - Standing Shoulder Row with Anchored Resistance  - 1 x daily - 7 x weekly - 3 sets - 10 reps - 3-sec hold - Shoulder extension with resistance - Neutral  - 1 x daily - 7 x weekly - 3 sets - 10 reps - 3-sec hold - Seated Cervical Retraction  - 1 x daily - 7 x weekly - 3 sets - 10 reps - 3-sec hold - Seated Cervical Sidebending Stretch  - 1 x daily - 7 x weekly - 2-min hold   ASSESSMENT:   CLINICAL IMPRESSION: Patient is a 36 y.o. 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  Upon assessment, the pt's primary impairments include painful and limited cervical flexion, Rt rotation, and Rt side bend AROM, TTP to Lt thoracic paraspinals and mid/ upper traps with palpable trigger points, tight BIL upper traps,  hypomobile and painful lower cervical and thoracic CPAs and Lt UPAs, and weak Lt shoulder and parascapular strength. Ruling up cervicothoracic facet dysfunction due to unilateral pain presentation, unilaterally impaired passive accessories, positive cervical quadrant testing, unilateral TTP with palpable trigger points, and report of pain radiating up the spine from the shoulder blades. Pt responded well to OMT today, reporting improved pain and demonstrating improved cervicothoracic passive accessory mobility following interventions. She will benefit from skilled PT to address her primary impairments and return to her prior level of function with less limitation.   OBJECTIVE IMPAIRMENTS decreased ROM, decreased strength, hypomobility, impaired flexibility, impaired UE functional use, improper body mechanics, postural dysfunction, and pain.    ACTIVITY LIMITATIONS cleaning, community activity, driving, occupation, Pharmacologist, yard work, and shopping.    PERSONAL FACTORS 1-2 comorbidities: asthma, HTN  are also affecting patient's functional outcome.      REHAB POTENTIAL: Good   CLINICAL DECISION MAKING: Stable/uncomplicated   EVALUATION COMPLEXITY: Low     GOALS: Goals reviewed with patient? Yes   SHORT TERM GOALS: Target date: 12/21/2021   Pt will report understanding and adherence to her HEP in order to promote independence in the management of her primary impairments. Baseline: HEP provided at eval Goal status: INITIAL     LONG TERM GOALS: Target date: 01/18/2022   Pt will achieve an NDI score of 10/50 (20%) in order to demonstrate improved functional ability as it relates to her cervicothoracic pain. Baseline: 23/50, 46% Goal status: INITIAL   2.  Pt will report ability to stand throughout her workday with 0-3/10 pain. Baseline: >8/10 pain after 2.5 hours Goal status: INITIAL   3.  Pt will achieve WNL cervical AROM in all planes with 0-3/10 pain in order to get dressed without  limitation. Baseline: See AROM chart Goal status: INITIAL   4.  Pt will achieve BIL global parascapular strength of 4+/5 or greater in order to prevent future exacerbations of mechanical cervicothoracic pain. Baseline: See MMT chart Goal status: INITIAL     PLAN: PT FREQUENCY: 2x/week   PT DURATION: 8 weeks   PLANNED INTERVENTIONS: Therapeutic exercises, Therapeutic activity, Neuromuscular re-education, Patient/Family education, Joint manipulation, Joint mobilization, Dry Needling, Electrical stimulation, Spinal manipulation, Spinal mobilization, Cryotherapy, Moist heat, Taping, Vasopneumatic device, Traction, and Manual therapy   PLAN FOR NEXT SESSION: Progress parascapular/ DNF strengthening, assess DNF endurance test, manual techniques/ OMT vs TPDN     Carmelina Dane, PT, DPT 11/23/21 1:54 PM   [employer] Authorization    Choose one: Rehabilitative   Standardized Assessment or Functional Outcome Tool: See Pain Assessment and NDI   Score or Percent Disability: 23/50, 46%   Body Parts Treated (Select each separately):  Cervicothoracic. Overall deficits/functional limitations for body part selected: moderate Shoulder. Overall deficits/functional limitations for body part selected: mild N/A.      If treatment provided at initial evaluation, no treatment charged due to lack of authorization   [employer], PT 02/08/2022, 10:08 PM

## 2022-02-09 ENCOUNTER — Ambulatory Visit: Payer: Medicaid Other | Attending: Nurse Practitioner

## 2022-03-07 ENCOUNTER — Ambulatory Visit (HOSPITAL_COMMUNITY)
Admission: EM | Admit: 2022-03-07 | Discharge: 2022-03-07 | Disposition: A | Discharge status: Home / Self Care | Payer: Medicaid Other | Attending: Urgent Care | Admitting: Urgent Care

## 2022-03-07 ENCOUNTER — Encounter (HOSPITAL_COMMUNITY): Payer: Self-pay

## 2022-03-07 DIAGNOSIS — B3731 Acute candidiasis of vulva and vagina: Secondary | ICD-10-CM | POA: Diagnosis present

## 2022-03-07 MED ORDER — FLUCONAZOLE 150 MG PO TABS: ORAL_TABLET | ORAL | 0 refills | Status: DC

## 2022-03-07 NOTE — Discharge Instructions (Addendum)
Your symptoms are most consistent with a vaginal yeast infection.  Please take 1 tablet of Diflucan today.  Do not drink any alcohol today as it can be hard on your liver. Monitor, you may not need any additional treatments, however if still symptomatic in 3 days, you may take the second Diflucan tablet. Avoid any form of intercourse until the Aptima swab results are obtained.

## 2022-03-07 NOTE — ED Provider Notes (Signed)
[facility]    CSN: [id number] Arrival date & time: 03/07/22  0847      History   Chief Complaint Chief Complaint  Patient presents with   vaginal irritation    HPI Cassandra Nicholson is a 36 y.o. female.   Pleasant 36 year old female presents today with concern of vaginal discharge and itching for the past 3 weeks.  She denies pelvic pain, dysuria, hematuria.  Thought she felt a small bump to the R labia.  Has been trying numerous over-the-counter products without relief.  Denies a known exposure to an STI, although she states she is "off and on with her partner".  Denies fever, rash, or any systemic symptoms.  Denies possible pregnancy, states tubal ligation.     Past Medical History:  Diagnosis Date   Asthma    as a child   Finger fracture, left 12/12/2012   left small P2 fx.   Headache(784.0)    migraines   Hypertension    no current meds.   Mild scoliosis    Seasonal allergies     There are no problems to display for this patient.   Past Surgical History:  Procedure Laterality Date   OPEN REDUCTION INTERNAL FIXATION (ORIF) METACARPAL Left 12/19/2012   Procedure: CLOSED REDUCTION PINNING LEFT SMALL P2 FRACTURE ;  Surgeon: Marlowe Shores, MD;  Location: [facility];  Service: Orthopedics;  Laterality: Left;   TUBAL LIGATION  02/12/2008    OB History     Gravida  4   Para  3   Term  2   Preterm  1   AB  1   Living  3      SAB  0   IAB  1   Ectopic  0   Multiple  0   Live Births  3            Home Medications    Prior to Admission medications   Medication Sig Start Date End Date Taking? Authorizing Provider  fluconazole (DIFLUCAN) 150 MG tablet Take one tab PO x 1 dose now. May repeat in 72 hours as needed 03/07/22  Yes Mckinley Olheiser L, PA  baclofen (LIORESAL) 20 MG tablet Take 20 mg by mouth 3 (three) times daily. Patient not taking: Reported on 03/07/2022    [provider]    Family  History Family History  Problem Relation Age of Onset   Hypertension Mother    Scoliosis Mother    Hypertension Father    Scoliosis Father    Hypertension Maternal Grandmother    Kidney failure Maternal Grandmother    Heart failure Maternal Grandmother    Arthritis Maternal Grandmother    Scoliosis Maternal Grandmother    Osteoporosis Maternal Grandmother    Hypertension Maternal Grandfather    Osteoporosis Maternal Grandfather    Hypertension Paternal Grandmother    Osteoporosis Paternal Grandmother    Arthritis Paternal Grandmother    Breast cancer Maternal Aunt    Lung cancer Maternal Aunt     Social History Social History   Tobacco Use   Smoking status: Every Day    Packs/day: 0.50    Years: 12.00    Total pack years: 6.00    Types: Cigarettes   Smokeless tobacco: Never  Vaping Use   Vaping Use: Never used  Substance Use Topics   Alcohol use: Yes    Comment: states socially.   Drug use: Yes    Types: Marijuana  Allergies   Patient has no known allergies.   Review of Systems Review of Systems  Genitourinary:  Positive for vaginal discharge.       Itching  All other systems reviewed and are negative.    Physical Exam Triage Vital Signs ED Triage Vitals  Enc Vitals Group     BP 03/07/22 0922 127/87     Pulse Rate 03/07/22 0922 83     Resp 03/07/22 0922 14     Temp 03/07/22 0922 98.3 F (36.8 C)     Temp Source 03/07/22 0922 Oral     SpO2 03/07/22 0922 99 %     Weight --      Height --      Head Circumference --      Peak Flow --      Pain Score 03/07/22 0921 0     Pain Loc --      Pain Edu? --      Excl. in GC? --    No data found.  Updated Vital Signs BP 127/87 (BP Location: Right Arm)   Pulse 83   Temp 98.3 F (36.8 C) (Oral)   Resp 14   SpO2 99%   Visual Acuity Right Eye Distance:   Left Eye Distance:   Bilateral Distance:    Right Eye Near:   Left Eye Near:    Bilateral Near:     Physical Exam Vitals and nursing  note reviewed. Chaperone present: deferred.  Constitutional:      Appearance: Normal appearance. She is well-developed and normal weight.  HENT:     Head: Normocephalic.  Cardiovascular:     Rate and Rhythm: Normal rate.     Heart sounds: No murmur heard. Pulmonary:     Effort: Pulmonary effort is normal. No respiratory distress.     Breath sounds: No wheezing.  Abdominal:     General: Abdomen is flat. Bowel sounds are normal. There is no distension. There are no signs of injury.     Palpations: Abdomen is soft. There is no shifting dullness, fluid wave, hepatomegaly, splenomegaly, mass or pulsatile mass.     Tenderness: There is no abdominal tenderness. There is no right CVA tenderness, left CVA tenderness, guarding or rebound. Negative signs include Murphy's sign, Rovsing's sign, McBurney's sign, psoas sign and obturator sign.     Hernia: No hernia is present.  Genitourinary:    General: Normal vulva.     Pubic Area: No rash or pubic lice.      Labia:        Right: No rash, tenderness, lesion or injury.        Left: No rash, tenderness, lesion or injury.      Urethra: No prolapse, urethral pain, urethral swelling or urethral lesion.     Vagina: No signs of injury and foreign body. Vaginal discharge (thick, curd like; dry erythematous introitus) present. No erythema, tenderness, bleeding, lesions or prolapsed vaginal walls.     Cervix: No cervical motion tenderness, discharge, friability, lesion, erythema or eversion.     Uterus: Normal. Not deviated, not enlarged, not fixed, not tender and no uterine prolapse.      Adnexa: Right adnexa normal and left adnexa normal.       Right: No mass, tenderness or fullness.         Left: No mass, tenderness or fullness.       Rectum: Normal.  Neurological:     Mental Status: She is alert.  UC Treatments / Results  Labs (all labs ordered are listed, but only abnormal results are displayed) Labs Reviewed  CERVICOVAGINAL ANCILLARY ONLY     EKG   Radiology No results found.  Procedures Procedures (including critical care time)  Medications Ordered in UC Medications - No data to display  Initial Impression / Assessment and Plan / UC Course  I have reviewed the triage vital signs and the nursing notes.  Pertinent labs & imaging results that were available during my care of the patient were reviewed by me and considered in my medical decision making (see chart for details).     Candidiasis of vagina - PO diflucan x 1 dose now, may repeat in 72 hours if needed.  Aptima swab collected to also rule out trichomonas or STIs.  Avoid intercourse until results received.  Final Clinical Impressions(s) / UC Diagnoses   Final diagnoses:  Candidiasis of vulva and vagina     Discharge Instructions      Your symptoms are most consistent with a vaginal yeast infection.  Please take 1 tablet of Diflucan today.  Do not drink any alcohol today as it can be hard on your liver. Monitor, you may not need any additional treatments, however if still symptomatic in 3 days, you may take the second Diflucan tablet. Avoid any form of intercourse until the Aptima swab results are obtained.     ED Prescriptions     Medication Sig Dispense Auth. Provider   fluconazole (DIFLUCAN) 150 MG tablet Take one tab PO x 1 dose now. May repeat in 72 hours as needed 2 tablet Blaise Grieshaber, [place] L, [place]      PDMP not reviewed this encounter.   Maretta Bees, [place] 03/07/22 1023

## 2022-03-07 NOTE — ED Triage Notes (Signed)
Pt presents with c/o vaginal irritation x 3 weeks. Pt endorses itching.

## 2022-03-08 ENCOUNTER — Telehealth (HOSPITAL_COMMUNITY): Payer: Self-pay | Admitting: Emergency Medicine

## 2022-03-08 LAB — CERVICOVAGINAL ANCILLARY ONLY
Bacterial Vaginitis (gardnerella): POSITIVE — AB
Candida Glabrata: NEGATIVE
Candida Vaginitis: NEGATIVE
Chlamydia: NEGATIVE
Comment: NEGATIVE
Comment: NEGATIVE
Comment: NEGATIVE
Comment: NEGATIVE
Comment: NEGATIVE
Comment: NORMAL
Neisseria Gonorrhea: NEGATIVE
Trichomonas: NEGATIVE

## 2022-03-08 MED ORDER — METRONIDAZOLE 500 MG PO TABS: 500.0000 mg | ORAL_TABLET | Freq: Two times a day (BID) | ORAL | 0 refills | Status: DC

## 2023-04-16 ENCOUNTER — Ambulatory Visit (HOSPITAL_COMMUNITY)
Admission: EM | Admit: 2023-04-16 | Discharge: 2023-04-16 | Disposition: A | Discharge status: Home / Self Care | Payer: Medicaid Other | Attending: Internal Medicine | Admitting: Internal Medicine

## 2023-04-16 ENCOUNTER — Encounter (HOSPITAL_COMMUNITY): Payer: Self-pay

## 2023-04-16 DIAGNOSIS — A059 Bacterial foodborne intoxication, unspecified: Secondary | ICD-10-CM

## 2023-04-16 LAB — POCT URINE PREGNANCY: Preg Test, Ur: NEGATIVE

## 2023-04-16 MED ORDER — ONDANSETRON 8 MG PO TBDP: 8.0000 mg | ORAL_TABLET | Freq: Three times a day (TID) | ORAL | 0 refills | Status: DC | PRN

## 2023-04-16 NOTE — Discharge Instructions (Addendum)
Your symptoms and examination are consistent with food poisoning.  You can take the nausea medicine every 8 hours as needed.  Ensure you are following a bland diet while your stomach and gastrointestinal system recover.  Try to drink at least 64 ounces of water daily to help rehydrate you.  Your blood pressure was elevated today at 137/97.  Please follow-up with [facility] regarding notes and medication.  Return to clinic for new or urgent symptoms.

## 2023-04-16 NOTE — ED Provider Notes (Signed)
[facility]    CSN: [id number] Arrival date & time: 04/16/23  1146      History   Chief Complaint Chief Complaint  Patient presents with   Emesis    HPI Cassandra Nicholson is a 37 y.o. female.   Patient presents to clinic with complaint of nausea, vomiting and diarrhea that started after she got home yesterday.  Reports about an hour after eating in the cafeteria she started to feel unwell with abdominal discomfort.  She got home and immediately had back-to-back episodes of emesis and diarrhea at the same time.  Denies any blood in stool or vomit.  After the rounds of emesis and diarrhea she went to lay down and had some ginger ale.  Today she has not had any vomiting or diarrhea, is tolerating p.o. food and fluids.  Has some abdominal discomfort and soreness. Denies fever.    The history is provided by the patient and medical records.  Emesis Associated symptoms: abdominal pain and diarrhea   Associated symptoms: no fever     Past Medical History:  Diagnosis Date   Asthma    as a child   Finger fracture, left 12/12/2012   left small P2 fx.   Headache(784.0)    migraines   Hypertension    no current meds.   Mild scoliosis    Seasonal allergies     There are no problems to display for this patient.   Past Surgical History:  Procedure Laterality Date   OPEN REDUCTION INTERNAL FIXATION (ORIF) METACARPAL Left 12/19/2012   Procedure: CLOSED REDUCTION PINNING LEFT SMALL P2 FRACTURE ;  Surgeon: Marlowe Shores, MD;  Location: [facility];  Service: Orthopedics;  Laterality: Left;   TUBAL LIGATION  02/12/2008    OB History     Gravida  4   Para  3   Term  2   Preterm  1   AB  1   Living  3      SAB  0   IAB  1   Ectopic  0   Multiple  0   Live Births  3            Home Medications    Prior to Admission medications   Medication Sig Start Date End Date Taking? Authorizing Provider  ondansetron (ZOFRAN-ODT) 8  MG disintegrating tablet Take 1 tablet (8 mg total) by mouth every 8 (eight) hours as needed. 04/16/23  Yes Rinaldo Ratel, [place], FNP  baclofen (LIORESAL) 20 MG tablet Take 20 mg by mouth 3 (three) times daily. Patient not taking: Reported on 03/07/2022    [provider]    Family History Family History  Problem Relation Age of Onset   Hypertension Mother    Scoliosis Mother    Hypertension Father    Scoliosis Father    Hypertension Maternal Grandmother    Kidney failure Maternal Grandmother    Heart failure Maternal Grandmother    Arthritis Maternal Grandmother    Scoliosis Maternal Grandmother    Osteoporosis Maternal Grandmother    Hypertension Maternal Grandfather    Osteoporosis Maternal Grandfather    Hypertension Paternal Grandmother    Osteoporosis Paternal Grandmother    Arthritis Paternal Grandmother    Breast cancer Maternal Aunt    Lung cancer Maternal Aunt     Social History Social History   Tobacco Use   Smoking status: Every Day    Current packs/day: 0.50    Average packs/day: 0.5 packs/day  for 12.0 years (6.0 ttl pk-yrs)    Types: Cigarettes   Smokeless tobacco: Never  Vaping Use   Vaping status: Never Used  Substance Use Topics   Alcohol use: Yes    Comment: states socially.   Drug use: Yes    Types: Marijuana     Allergies   Patient has no known allergies.   Review of Systems Review of Systems  Constitutional:  Negative for fever.  Gastrointestinal:  Positive for abdominal pain, diarrhea, nausea and vomiting.  Genitourinary:  Negative for dysuria.     Physical Exam Triage Vital Signs ED Triage Vitals  Encounter Vitals Group     BP 04/16/23 1212 (!) 137/97     Systolic BP Percentile --      Diastolic BP Percentile --      Pulse Rate 04/16/23 1212 82     Resp 04/16/23 1212 18     Temp 04/16/23 1212 98.7 F (37.1 C)     Temp Source 04/16/23 1212 Oral     SpO2 04/16/23 1212 96 %     Weight --      Height --      Head  Circumference --      Peak Flow --      Pain Score 04/16/23 1213 2     Pain Loc --      Pain Education --      Exclude from Growth Chart --    No data found.  Updated Vital Signs BP (!) 137/97 (BP Location: Right Arm)   Pulse 82   Temp 98.7 F (37.1 C) (Oral)   Resp 18   LMP 03/30/2023   SpO2 96%   Visual Acuity Right Eye Distance:   Left Eye Distance:   Bilateral Distance:    Right Eye Near:   Left Eye Near:    Bilateral Near:     Physical Exam Vitals and nursing note reviewed.  Constitutional:      Appearance: Normal appearance.  HENT:     Head: Normocephalic and atraumatic.     Right Ear: External ear normal.     Left Ear: External ear normal.     Nose: Nose normal.     Mouth/Throat:     Mouth: Mucous membranes are moist.  Eyes:     Conjunctiva/sclera: Conjunctivae normal.  Cardiovascular:     Rate and Rhythm: Normal rate.  Pulmonary:     Effort: Pulmonary effort is normal. No respiratory distress.  Abdominal:     General: Abdomen is flat. Bowel sounds are normal. There is no distension.     Palpations: Abdomen is soft. There is no mass.     Tenderness: There is no abdominal tenderness. There is no guarding or rebound.     Hernia: No hernia is present.  Musculoskeletal:        General: Normal range of motion.     Cervical back: Normal range of motion.  Skin:    General: Skin is warm and dry.  Neurological:     General: No focal deficit present.     Mental Status: She is alert and oriented to person, place, and time.  Psychiatric:        Mood and Affect: Mood normal.        Behavior: Behavior normal.      UC Treatments / Results  Labs (all labs ordered are listed, but only abnormal results are displayed) Labs Reviewed  POCT URINE PREGNANCY    EKG   Radiology  No results found.  Procedures Procedures (including critical care time)  Medications Ordered in UC Medications - No data to display  Initial Impression / Assessment and Plan /  UC Course  I have reviewed the triage vital signs and the nursing notes.  Pertinent labs & imaging results that were available during my care of the patient were reviewed by me and considered in my medical decision making (see chart for details).  Vitals and triage reviewed, patient is hemodynamically stable.  Abdomen is soft and nontender with active bowel sounds.  Suspect food poisoning related to food at the cafeteria.  Tolerating p.o. food and fluids currently, low concern for dehydration.  Symptomatic management with bland diet and nausea medication discussed.  Plan of care, follow-up care and return precautions given, no questions at this time.  Work note provided.     Final Clinical Impressions(s) / UC Diagnoses   Final diagnoses:  Food poisoning     Discharge Instructions      Your symptoms and examination are consistent with food poisoning.  You can take the nausea medicine every 8 hours as needed.  Ensure you are following a bland diet while your stomach and gastrointestinal system recover.  Try to drink at least 64 ounces of water daily to help rehydrate you.  Your blood pressure was elevated today at 137/97.  Please follow-up with [facility] regarding notes and medication.  Return to clinic for new or urgent symptoms.      ED Prescriptions     Medication Sig Dispense Auth. Provider   ondansetron (ZOFRAN-ODT) 8 MG disintegrating tablet Take 1 tablet (8 mg total) by mouth every 8 (eight) hours as needed. 20 tablet Leslieann Whisman, [place], [place]      PDMP not reviewed this encounter.   Jakai Onofre, [place] N, [place] 04/16/23 1258

## 2023-04-16 NOTE — ED Triage Notes (Signed)
Pt states had n/v/d x1 yesterday after eating in the cafeteria after work. States needs a work note.

## 2023-09-20 ENCOUNTER — Encounter: Payer: Medicaid Other | Admitting: Radiology

## 2024-01-26 ENCOUNTER — Ambulatory Visit (HOSPITAL_COMMUNITY): Admission: EM | Admit: 2024-01-26 | Discharge: 2024-01-26 | Disposition: A | Discharge status: Home / Self Care

## 2024-01-26 ENCOUNTER — Encounter (HOSPITAL_COMMUNITY): Payer: Self-pay

## 2024-01-26 DIAGNOSIS — I10 Essential (primary) hypertension: Secondary | ICD-10-CM | POA: Diagnosis not present

## 2024-01-26 DIAGNOSIS — J0100 Acute maxillary sinusitis, unspecified: Secondary | ICD-10-CM

## 2024-01-26 DIAGNOSIS — R519 Headache, unspecified: Secondary | ICD-10-CM | POA: Diagnosis not present

## 2024-01-26 MED ORDER — KETOROLAC TROMETHAMINE 60 MG/2ML IM SOLN
INTRAMUSCULAR | Status: AC
Start: 2024-01-26 — End: ?
  Filled 2024-01-26: qty 2

## 2024-01-26 MED ORDER — AZITHROMYCIN 500 MG PO TABS: 500.0000 mg | ORAL_TABLET | Freq: Every day | ORAL | 0 refills | Status: AC

## 2024-01-26 MED ORDER — PSEUDOEPH-BROMPHEN-DM 30-2-10 MG/5ML PO SYRP: 10.0000 mL | ORAL_SOLUTION | Freq: Four times a day (QID) | ORAL | 0 refills | Status: DC | PRN

## 2024-01-26 MED ORDER — CLONIDINE HCL 0.1 MG PO TABS
ORAL_TABLET | ORAL | Status: AC
  Filled 2024-01-26: qty 1

## 2024-01-26 MED ORDER — PREDNISONE 20 MG PO TABS: 40.0000 mg | ORAL_TABLET | Freq: Every day | ORAL | 0 refills | Status: AC

## 2024-01-26 MED ORDER — KETOROLAC TROMETHAMINE 60 MG/2ML IM SOLN
60.0000 mg | Freq: Once | INTRAMUSCULAR | Status: AC
  Administered 2024-01-26: 60 mg via INTRAMUSCULAR

## 2024-01-26 MED ORDER — FLUTICASONE PROPIONATE 50 MCG/ACT NA SUSP
2.0000 | Freq: Every day | NASAL | 0 refills | Status: DC
Start: 2024-01-26 — End: 2024-07-09

## 2024-01-26 MED ORDER — LOSARTAN POTASSIUM-HCTZ 50-12.5 MG PO TABS: 1.0000 | ORAL_TABLET | Freq: Every morning | ORAL | 0 refills | Status: AC

## 2024-01-26 MED ORDER — CLONIDINE HCL 0.1 MG PO TABS
0.2000 mg | ORAL_TABLET | Freq: Once | ORAL | Status: AC
  Administered 2024-01-26: 0.2 mg via ORAL

## 2024-01-26 NOTE — Discharge Instructions (Addendum)
 You were seen today for a sinus infection, particularly affecting the maxillary sinuses.You were prescribed a course of Azithromycin  (an antibiotic) to treat the sinus infection, as well as Prednisone  to reduce inflammation, Flonase nasal spray to help with nasal congestion, and Bromfed-DM to help with your cough and other upper respiratory symptoms. It's important to take all medications exactly as directed. Flonase may take a few days of consistent use to show benefit. Supportive care measures like staying well hydrated, using warm compresses over the sinuses, and resting can help ease your symptoms. Using a cool mist humidifier at home to keep humidity levels above 50% can be helpful. You can also inhale steam for 10 to 15 minutes, 3 to 4 times a day. This can be done by sitting in the bathroom with a hot shower running, or by using over-the-counter vapor shower tablets to help with nasal congestion. Try to avoid cool or dry air as much as possible. When you sleep, keep your head elevated to help reduce post-nasal drainage. Be sure to get enough rest every night to support your recovery. Don't forget to replace your toothbrush once you start feeling better.   It's normal for a cough to linger for several weeks after a respiratory illness, even after other symptoms have resolved. This happens because the airways remain irritated and take time to fully heal. As long as the cough gradually improves and there are no new concerning symptoms, this is part of the normal recovery process.  During your visit, your blood pressure was significantly elevated at 198/107, and you reported a severe headache. You mentioned not taking your blood pressure medication this morning out of concern for interactions with medications you might be prescribed today. To address your high blood pressure and headache, you were given a dose of Clonidine and an anti-inflammatory injection (Toradol ) in the clinic. Your blood pressure  decreased to 155/110 and you noted significant improvement in your headache afterward.  Given your history of high blood pressure and your elevated reading today, your blood pressure medication has been changed from lisinopril-HCTZ to losartan 50 mg combined with HCTZ 12.5 mg to be taken once daily in the morning. This change was made because certain blood pressure medications like lisinopril carry a higher risk of side effects, such as angioedema, particularly in African American patients. A 30-day supply of your new medication was provided today. It is important that you begin taking your new blood pressure medication as prescribed starting tomorrow morning and continue taking it daily. Do not restart your previous medication. Monitor for any dizziness, swelling, or unusual symptoms while adjusting to the new medication. Be sure to schedule an appointment with your primary care provider before you run out of this new prescription so that your blood pressure can be reassessed and long-term management can be planned. Please return sooner or seek emergency care if you experience any worsening symptoms, such as persistent or severe headache, chest pain, shortness of breath, or vision changes.

## 2024-01-26 NOTE — ED Provider Notes (Signed)
 [facility]    CSN: [id number] Arrival date & time: 01/26/24  1018      History   Chief Complaint Chief Complaint  Patient presents with   Otalgia    HPI Cassandra Nicholson is a 38 y.o. female.   [name] is a 38 y.o. female that presents with ear issues that started two days ago on Wednesday. The left ear is more affected, though both ears are involved. Associated symptoms include nasal congestion, head pressure, body aches, sneezing, and cough with some productive sputum. The patient denies wheezing or shortness of breath. The patient reports experiencing some subjective fevers. The patient reports some nasal drainage going down the back of their throat. She denies nausea, vomiting, or diarrhea. The patient mentions having a severe headache. She has taken tylenol  and motrin  without any relief in her headache or other symptoms. The patient reports recent exposure to her daughter who was recently sick with similar symptoms. The patient is a chronic cigarette smoker. Her blood pressure is noted to be elevated and she mentions having high blood pressure, for which they take medication (lisinopril-HCTZ). The patient did not take their blood pressure medication today, concerned about potential interactions with any new prescriptions that may be given at today's visit.   The following portions of the patient's history were reviewed and updated as appropriate: allergies, current medications, past family history, past medical history, past social history, past surgical history, and problem list.    Past Medical History:  Diagnosis Date   Asthma    as a child   Finger fracture, left 12/12/2012   left small P2 fx.   Headache(784.0)    migraines   Hypertension    no current meds.   Mild scoliosis    Seasonal allergies     There are no active problems to display for this patient.   Past Surgical History:  Procedure Laterality Date   OPEN REDUCTION INTERNAL  FIXATION (ORIF) METACARPAL Left 12/19/2012   Procedure: CLOSED REDUCTION PINNING LEFT SMALL P2 FRACTURE ;  Surgeon: Hedy Living, MD;  Location: [facility];  Service: Orthopedics;  Laterality: Left;   TUBAL LIGATION  02/12/2008    OB History     Gravida  4   Para  3   Term  2   Preterm  1   AB  1   Living  3      SAB  0   IAB  1   Ectopic  0   Multiple  0   Live Births  3            Home Medications    Prior to Admission medications   Medication Sig Start Date End Date Taking? Authorizing Provider  azithromycin  (ZITHROMAX ) 500 MG tablet Take 1 tablet (500 mg total) by mouth daily for 5 days. 01/26/24 01/31/24 Yes Maryruth Sol, FNP  brompheniramine-pseudoephedrine-DM 30-2-10 MG/5ML syrup Take 10 mLs by mouth every 6 (six) hours as needed. 01/26/24  Yes Amadea Keagy, FNP  fluticasone (FLONASE) 50 MCG/ACT nasal spray Place 2 sprays into both nostrils daily. 01/26/24  Yes Maryruth Sol, FNP  losartan-hydrochlorothiazide (HYZAAR) 50-12.5 MG tablet Take 1 tablet by mouth every morning. 01/26/24  Yes Maryruth Sol, FNP  predniSONE  (DELTASONE ) 20 MG tablet Take 2 tablets (40 mg total) by mouth daily for 5 days. 01/26/24 01/31/24 Yes Maryruth Sol, FNP    Family History Family History  Problem Relation Age of Onset   Hypertension Mother  Scoliosis Mother    Hypertension Father    Scoliosis Father    Hypertension Maternal Grandmother    Kidney failure Maternal Grandmother    Heart failure Maternal Grandmother    Arthritis Maternal Grandmother    Scoliosis Maternal Grandmother    Osteoporosis Maternal Grandmother    Hypertension Maternal Grandfather    Osteoporosis Maternal Grandfather    Hypertension Paternal Grandmother    Osteoporosis Paternal Grandmother    Arthritis Paternal Grandmother    Breast cancer Maternal Aunt    Lung cancer Maternal Aunt     Social History Social History   Tobacco Use   Smoking status: Every  Day    Current packs/day: 0.50    Average packs/day: 0.5 packs/day for 12.0 years (6.0 ttl pk-yrs)    Types: Cigarettes   Smokeless tobacco: Never  Vaping Use   Vaping status: Never Used  Substance Use Topics   Alcohol use: Yes    Comment: states socially.   Drug use: Yes    Types: Marijuana     Allergies   Patient has no known allergies.   Review of Systems Review of Systems  Constitutional:  Positive for appetite change (decreased), chills and fever (subjective).  HENT:  Positive for congestion, ear pain (bilateral, L>R), postnasal drip, rhinorrhea, sinus pressure and sneezing.   Respiratory:  Positive for cough (minimally productive) and wheezing (a little yesterday; improved after using MDI).   Gastrointestinal:  Positive for nausea (mild). Negative for diarrhea and vomiting.  Musculoskeletal:  Positive for myalgias.  Neurological:  Positive for headaches.  All other systems reviewed and are negative.    Physical Exam Triage Vital Signs ED Triage Vitals [01/26/24 1114]  Encounter Vitals Group     BP (!) 198/107     Systolic BP Percentile      Diastolic BP Percentile      Pulse Rate 83     Resp 18     Temp 98.6 F (37 C)     Temp Source Oral     SpO2 98 %     Weight      Height      Head Circumference      Peak Flow      Pain Score 10     Pain Loc      Pain Education      Exclude from Growth Chart    No data found.  Updated Vital Signs BP (!) 155/110 (BP Location: Left Arm)   Pulse 83   Temp 98.6 F (37 C) (Oral)   Resp 18   LMP 01/11/2024 (Approximate)   SpO2 98%   Visual Acuity Right Eye Distance:   Left Eye Distance:   Bilateral Distance:    Right Eye Near:   Left Eye Near:    Bilateral Near:     Physical Exam Vitals reviewed.  Constitutional:      General: She is awake. She is not in acute distress.    Appearance: Normal appearance. She is well-developed. She is not ill-appearing, toxic-appearing or diaphoretic.  HENT:     Head:  Normocephalic.     Right Ear: Hearing, tympanic membrane, ear canal and external ear normal. No drainage, swelling or tenderness. No middle ear effusion. Tympanic membrane is not erythematous.     Left Ear: Hearing, tympanic membrane, ear canal and external ear normal. No drainage, swelling or tenderness.  No middle ear effusion. Tympanic membrane is not erythematous.     Nose: Congestion present.  Right Sinus: Maxillary sinus tenderness present. No frontal sinus tenderness.     Left Sinus: Maxillary sinus tenderness present. No frontal sinus tenderness.     Mouth/Throat:     Lips: Pink.     Mouth: Mucous membranes are moist.     Pharynx: Oropharynx is clear. Uvula midline. No pharyngeal swelling, oropharyngeal exudate, posterior oropharyngeal erythema or uvula swelling.     Tonsils: No tonsillar exudate or tonsillar abscesses.  Eyes:     General: Vision grossly intact.     Conjunctiva/sclera: Conjunctivae normal.  Cardiovascular:     Rate and Rhythm: Normal rate and regular rhythm.     Heart sounds: Normal heart sounds.  Pulmonary:     Effort: Pulmonary effort is normal.     Breath sounds: Normal breath sounds and air entry.  Musculoskeletal:        General: Normal range of motion.     Cervical back: Full passive range of motion without pain, normal range of motion and neck supple.  Lymphadenopathy:     Cervical: No cervical adenopathy.  Skin:    General: Skin is warm and dry.  Neurological:     General: No focal deficit present.     Mental Status: She is alert and oriented to person, place, and time.     Sensory: Sensation is intact.     Motor: Motor function is intact.     Coordination: Coordination is intact.     Gait: Gait is intact.  Psychiatric:        Mood and Affect: Mood normal.        Behavior: Behavior normal. Behavior is cooperative.      UC Treatments / Results  Labs (all labs ordered are listed, but only abnormal results are displayed) Labs Reviewed - No  data to display  EKG   Radiology No results found.  Procedures Procedures (including critical care time)  Medications Ordered in UC Medications  ketorolac  (TORADOL ) injection 60 mg (60 mg Intramuscular Given 01/26/24 1218)  cloNIDine (CATAPRES) tablet 0.2 mg (0.2 mg Oral Given 01/26/24 1217)    Initial Impression / Assessment and Plan / UC Course  I have reviewed the triage vital signs and the nursing notes.  Pertinent labs & imaging results that were available during my care of the patient were reviewed by me and considered in my medical decision making (see chart for details).     38 year old female presenting with bilateral ear discomfort that began two days ago, with nasal congestion, head pressure, body aches, sneezing, productive cough, postnasal drainage, subjective fevers, and a severe headache unrelieved by Tylenol  or Motrin . Denies wheezing, shortness of breath, nausea, vomiting, or diarrhea. She has recent sick contact with her daughter. She is a chronic smoker and has a history of hypertension, currently on lisinopril-HCTZ, though she did not take her medication today. On exam, she is alert, afebrile, uncomfortable but nontoxic. Findings are consistent with acute maxillary sinusitis. Azithromycin , prednisone , Flonase, and Bromfed-DM were prescribed along with supportive care instructions. She was noted to be significantly hypertensive at 198/107 with a severe headache. Clonidine 0.2 mg and Toradol  60 mg IM were administered in clinic with subsequent improvement of BP to 155/110 and relief of headache. Due to her ethnicity and risk of angioedema with ACE inhibitors, her antihypertensive regimen was changed to losartan 50 mg/HCTZ 12.5 mg daily. A 30-day supply was given, and she was advised to follow up with her PCP before running out for continued hypertension management.  Today's evaluation  has revealed no signs of a dangerous process. Discussed diagnosis with patient and/or  guardian. Patient and/or guardian aware of their diagnosis, possible red flag symptoms to watch out for and need for close follow up. Patient and/or guardian understands verbal and written discharge instructions. Patient and/or guardian comfortable with plan and disposition.  Patient and/or guardian has a clear mental status at this time, good insight into illness (after discussion and teaching) and has clear judgment to make decisions regarding their care  Documentation was completed with the aid of voice recognition software. Transcription may contain typographical errors. Final Clinical Impressions(s) / UC Diagnoses   Final diagnoses:  Acute non-recurrent maxillary sinusitis  Uncontrolled hypertension  Headache due to hypertension     Discharge Instructions      You were seen today for a sinus infection, particularly affecting the maxillary sinuses.You were prescribed a course of Azithromycin  (an antibiotic) to treat the sinus infection, as well as Prednisone  to reduce inflammation, Flonase  nasal spray to help with nasal congestion, and Bromfed-DM to help with your cough and other upper respiratory symptoms. It's important to take all medications exactly as directed. Flonase  may take a few days of consistent use to show benefit. Supportive care measures like staying well hydrated, using warm compresses over the sinuses, and resting can help ease your symptoms. Using a cool mist humidifier at home to keep humidity levels above 50% can be helpful. You can also inhale steam for 10 to 15 minutes, 3 to 4 times a day. This can be done by sitting in the bathroom with a hot shower running, or by using over-the-counter vapor shower tablets to help with nasal congestion. Try to avoid cool or dry air as much as possible. When you sleep, keep your head elevated to help reduce post-nasal drainage. Be sure to get enough rest every night to support your recovery. Don't forget to replace your toothbrush once you  start feeling better.   It's normal for a cough to linger for several weeks after a respiratory illness, even after other symptoms have resolved. This happens because the airways remain irritated and take time to fully heal. As long as the cough gradually improves and there are no new concerning symptoms, this is part of the normal recovery process.  During your visit, your blood pressure was significantly elevated at 198/107, and you reported a severe headache. You mentioned not taking your blood pressure medication this morning out of concern for interactions with medications you might be prescribed today. To address your high blood pressure and headache, you were given a dose of Clonidine  and an anti-inflammatory injection (Toradol ) in the clinic. Your blood pressure decreased to 155/110 and you noted significant improvement in your headache afterward.  Given your history of high blood pressure and your elevated reading today, your blood pressure medication has been changed from lisinopril-HCTZ to losartan  50 mg combined with HCTZ 12.5 mg to be taken once daily in the morning. This change was made because certain blood pressure medications like lisinopril carry a higher risk of side effects, such as angioedema, particularly in African American patients. A 30-day supply of your new medication was provided today. It is important that you begin taking your new blood pressure medication as prescribed starting tomorrow morning and continue taking it daily. Do not restart your previous medication. Monitor for any dizziness, swelling, or unusual symptoms while adjusting to the new medication. Be sure to schedule an appointment with your primary care provider before you run out of  this new prescription so that your blood pressure can be reassessed and long-term management can be planned. Please return sooner or seek emergency care if you experience any worsening symptoms, such as persistent or severe headache,  chest pain, shortness of breath, or vision changes.    ED Prescriptions     Medication Sig Dispense Auth. Provider   losartan-hydrochlorothiazide (HYZAAR) 50-12.5 MG tablet Take 1 tablet by mouth every morning. 30 tablet Maryruth Sol, FNP   azithromycin  (ZITHROMAX ) 500 MG tablet Take 1 tablet (500 mg total) by mouth daily for 5 days. 5 tablet Maryruth Sol, FNP   predniSONE  (DELTASONE ) 20 MG tablet Take 2 tablets (40 mg total) by mouth daily for 5 days. 10 tablet Zanobia Griebel, FNP   fluticasone (FLONASE) 50 MCG/ACT nasal spray Place 2 sprays into both nostrils daily. 16 g Maryruth Sol, FNP   brompheniramine-pseudoephedrine-DM 30-2-10 MG/5ML syrup Take 10 mLs by mouth every 6 (six) hours as needed. 118 mL Maryruth Sol, FNP      PDMP not reviewed this encounter.   Maryruth Sol, [place] 01/26/24 1310

## 2024-01-26 NOTE — ED Triage Notes (Signed)
 Pt c/o lt facial/ear pain/pressure, headache, and runny nose x2 days. States took tylenol  and benadryl at 5am with no relief.

## 2024-03-04 ENCOUNTER — Encounter (HOSPITAL_COMMUNITY): Payer: Self-pay | Admitting: Emergency Medicine

## 2024-03-04 ENCOUNTER — Ambulatory Visit (INDEPENDENT_AMBULATORY_CARE_PROVIDER_SITE_OTHER)

## 2024-03-04 ENCOUNTER — Ambulatory Visit (HOSPITAL_COMMUNITY)
Admission: EM | Admit: 2024-03-04 | Discharge: 2024-03-04 | Disposition: A | Discharge status: Home / Self Care | Attending: Physician Assistant | Admitting: Physician Assistant

## 2024-03-04 DIAGNOSIS — M546 Pain in thoracic spine: Secondary | ICD-10-CM

## 2024-03-04 MED ORDER — KETOROLAC TROMETHAMINE 30 MG/ML IJ SOLN
INTRAMUSCULAR | Status: AC
  Filled 2024-03-04: qty 1

## 2024-03-04 MED ORDER — KETOROLAC TROMETHAMINE 30 MG/ML IJ SOLN
30.0000 mg | Freq: Once | INTRAMUSCULAR | Status: AC
  Administered 2024-03-04: 30 mg via INTRAMUSCULAR

## 2024-03-04 MED ORDER — LIDOCAINE 5 % EX PTCH: 1.0000 | MEDICATED_PATCH | CUTANEOUS | 0 refills | Status: AC

## 2024-03-04 MED ORDER — BACLOFEN 10 MG PO TABS
10.0000 mg | ORAL_TABLET | Freq: Three times a day (TID) | ORAL | 0 refills | Status: DC | PRN
Start: 2024-03-04 — End: 2024-07-09

## 2024-03-04 NOTE — ED Triage Notes (Signed)
 Pt c/o back pain fo r3-4 days. Today when on her bed typing on her computer had pop in her back. Tried tylenol  today at 10 am but not helping.

## 2024-03-04 NOTE — ED Provider Notes (Signed)
 [facility]    CSN: [id number] Arrival date & time: 03/04/24  1025      History   Chief Complaint Chief Complaint  Patient presents with   Back Pain    HPI Cassandra Nicholson is a 38 y.o. female.   Patient presents today with a several day history of left thoracic back pain that has significantly worsened in the past few hours.  Reports that she has had intermittent pain for a while but then was sitting in her bed twisted working on a laptop when she felt a pop and has had ongoing pain since that time.  Pain is rated 10 on a 0-10 pain scale, described as sharp, worse with palpation or short movements, no alleviating factors notified.  She has been taking Tylenol  without improvement of symptoms.  She denies any numbness or paresthesias in her extremities.  She is right-handed.  Denies any previous surgery involving her back.  She has no concern for pregnancy as she is status post tubal ligation.  She is having difficulty with daily activities as result of pain.    Past Medical History:  Diagnosis Date   Asthma    as a child   Finger fracture, left 12/12/2012   left small P2 fx.   Headache(784.0)    migraines   Hypertension    no current meds.   Mild scoliosis    Seasonal allergies     There are no active problems to display for this patient.   Past Surgical History:  Procedure Laterality Date   OPEN REDUCTION INTERNAL FIXATION (ORIF) METACARPAL Left 12/19/2012   Procedure: CLOSED REDUCTION PINNING LEFT SMALL P2 FRACTURE ;  Surgeon: Donnice DELENA Robinsons, MD;  Location: [facility];  Service: Orthopedics;  Laterality: Left;   TUBAL LIGATION  02/12/2008    OB History     Gravida  4   Para  3   Term  2   Preterm  1   AB  1   Living  3      SAB  0   IAB  1   Ectopic  0   Multiple  0   Live Births  3            Home Medications    Prior to Admission medications   Medication Sig Start Date End Date Taking? Authorizing  Provider  baclofen  (LIORESAL ) 10 MG tablet Take 1 tablet (10 mg total) by mouth 3 (three) times daily as needed for muscle spasms. 03/04/24  Yes Jewell Haught K, PA-C  lidocaine  (LIDODERM ) 5 % Place 1 patch onto the skin daily. Remove & Discard patch within 12 hours or as directed by MD 03/04/24  Yes Curvin Hunger K, PA-C  brompheniramine-pseudoephedrine-DM 30-2-10 MG/5ML syrup Take 10 mLs by mouth every 6 (six) hours as needed. 01/26/24   Murrill, Samantha, FNP  fluticasone  (FLONASE ) 50 MCG/ACT nasal spray Place 2 sprays into both nostrils daily. 01/26/24   Murrill, Samantha, FNP  losartan -hydrochlorothiazide (HYZAAR) 50-12.5 MG tablet Take 1 tablet by mouth every morning. 01/26/24   Iola Lukes, FNP    Family History Family History  Problem Relation Age of Onset   Hypertension Mother    Scoliosis Mother    Hypertension Father    Scoliosis Father    Hypertension Maternal Grandmother    Kidney failure Maternal Grandmother    Heart failure Maternal Grandmother    Arthritis Maternal Grandmother    Scoliosis Maternal Grandmother    Osteoporosis Maternal  Grandmother    Hypertension Maternal Grandfather    Osteoporosis Maternal Grandfather    Hypertension Paternal Grandmother    Osteoporosis Paternal Grandmother    Arthritis Paternal Grandmother    Breast cancer Maternal Aunt    Lung cancer Maternal Aunt     Social History Social History   Tobacco Use   Smoking status: Every Day    Current packs/day: 0.50    Average packs/day: 0.5 packs/day for 12.0 years (6.0 ttl pk-yrs)    Types: Cigarettes   Smokeless tobacco: Never  Vaping Use   Vaping status: Never Used  Substance Use Topics   Alcohol use: Yes    Comment: states socially.   Drug use: Yes    Types: Marijuana     Allergies   Patient has no known allergies.   Review of Systems Review of Systems  Constitutional:  Positive for activity change. Negative for appetite change, fatigue and fever.  Gastrointestinal:   Negative for nausea and vomiting.  Musculoskeletal:  Positive for back pain. Negative for arthralgias and myalgias.  Skin:  Negative for rash and wound.  Neurological:  Negative for weakness and numbness.     Physical Exam Triage Vital Signs ED Triage Vitals  Encounter Vitals Group     BP 03/04/24 1111 135/83     Girls Systolic BP Percentile --      Girls Diastolic BP Percentile --      Boys Systolic BP Percentile --      Boys Diastolic BP Percentile --      Pulse Rate 03/04/24 1111 90     Resp 03/04/24 1111 19     Temp 03/04/24 1111 97.8 F (36.6 C)     Temp Source 03/04/24 1111 Oral     SpO2 03/04/24 1111 96 %     Weight --      Height --      Head Circumference --      Peak Flow --      Pain Score 03/04/24 1110 10     Pain Loc --      Pain Education --      Exclude from Growth Chart --    No data found.  Updated Vital Signs BP 135/83 (BP Location: Right Arm)   Pulse 90   Temp 97.8 F (36.6 C) (Oral)   Resp 19   LMP 02/14/2024 (Approximate)   SpO2 96%   Visual Acuity Right Eye Distance:   Left Eye Distance:   Bilateral Distance:    Right Eye Near:   Left Eye Near:    Bilateral Near:     Physical Exam Vitals reviewed.  Constitutional:      General: She is awake. She is not in acute distress.    Appearance: Normal appearance. She is well-developed. She is not ill-appearing.     Comments: Very pleasant female presented age in no acute distress sitting comfortably in exam room  HENT:     Head: Normocephalic and atraumatic.  Cardiovascular:     Rate and Rhythm: Normal rate and regular rhythm.     Heart sounds: Normal heart sounds, S1 normal and S2 normal. No murmur heard. Pulmonary:     Effort: Pulmonary effort is normal.     Breath sounds: Normal breath sounds. No wheezing, rhonchi or rales.     Comments: Clear to auscultation bilaterally Abdominal:     Palpations: Abdomen is soft.     Tenderness: There is no abdominal tenderness. There is no right  CVA  tenderness, left CVA tenderness, guarding or rebound.  Musculoskeletal:     Cervical back: No tenderness or bony tenderness. No pain with movement.     Thoracic back: Tenderness and bony tenderness present.     Lumbar back: No tenderness or bony tenderness.       Back:     Comments: Back: Pain percussion over thoracic vertebrae without deformity or step-off noted.  Tender to palpation over left thoracic paraspinal muscles and along trapezius.  Normal active range of motion at shoulder bilaterally.  Strength 5/5 bilateral upper extremities.  Hands are neurovascularly intact.  Psychiatric:        Behavior: Behavior is cooperative.      UC Treatments / Results  Labs (all labs ordered are listed, but only abnormal results are displayed) Labs Reviewed - No data to display  EKG   Radiology DG Thoracic Spine 2 View Result Date: 03/04/2024 CLINICAL DATA:  Thoracic back pain for 3-4 days.  Pop in back today. EXAM: THORACIC SPINE 2 VIEWS COMPARISON:  Thoracic radiographs 12/18/2008. FINDINGS: Mild motion artifact on the lateral views. There are 12 rib-bearing thoracic type vertebral bodies. The alignment is normal. The disc spaces are preserved with minimal intervertebral spurring. No evidence of acute fracture, paraspinal hematoma or widening of the interpedicular distance. IMPRESSION: No acute findings. Electronically Signed   By: Elsie Perone M.D.   On: 03/04/2024 12:49    Procedures Procedures (including critical care time)  Medications Ordered in UC Medications  ketorolac  (TORADOL ) 30 MG/ML injection 30 mg (30 mg Intramuscular Given 03/04/24 1141)    Initial Impression / Assessment and Plan / UC Course  I have reviewed the triage vital signs and the nursing notes.  Pertinent labs & imaging results that were available during my care of the patient were reviewed by me and considered in my medical decision making (see chart for details).     Patient is well-appearing, afebrile,  nontoxic, nontachycardic.  Suspect musculoskeletal etiology given pain is reproducible on exam.  Given bony tenderness over thoracic vertebrae with reported popping sensation x-ray was obtained that showed no acute bony abnormality.  Suspect muscle strain as etiology of symptoms.  Patient was given Toradol  in clinic but this did not provide significant improvement of symptoms.  She was instructed to avoid NSAIDs for 24 hours but can use Tylenol  as needed.  She was started on baclofen  up to 3 times a day.  We discussed that this can be sedating and she is not to drive or drink alcohol with taking it.  She can apply lidocaine  patch during the day and then remove this at night using only 1 patch per 24 hours.  Recommended that she follow-up with sports medicine if her symptoms or not improving.  She was given the contact information for local provider with instruction call to schedule an appointment.  We discussed that if her symptoms change or worsen she needs to be seen emergently.  Strict return precautions given.  Work excuse note provided.  Final Clinical Impressions(s) / UC Diagnoses   Final diagnoses:  Acute left-sided thoracic back pain     Discharge Instructions      Your x-ray was normal.  We gave an injection of Toradol  today so please avoid NSAIDs including aspirin, ibuprofen /Advil , naproxen /Aleve  for the next 24 hours.  You can use Tylenol /acetaminophen  as needed.  Take baclofen  up to 3 times a day.  This will make you sleepy so do not drive or drink alcohol with taking it.  Apply lidocaine  patch during the day and then remove this at night.  Use only 1 patch per 24 hours.  Use heat and gentle stretch for additional symptom relief.  If you are not feeling better within a few days please follow-up with sports medicine; call to schedule an appointment.  If anything worsens and you have worsening pain, fever, shortness of breath, chest pain, heart racing you need to go to the ER.     ED  Prescriptions     Medication Sig Dispense Auth. Provider   baclofen  (LIORESAL ) 10 MG tablet Take 1 tablet (10 mg total) by mouth 3 (three) times daily as needed for muscle spasms. 30 each Pinchus Weckwerth K, PA-C   lidocaine  (LIDODERM ) 5 % Place 1 patch onto the skin daily. Remove & Discard patch within 12 hours or as directed by MD 30 patch Culver Feighner K, PA-C      PDMP not reviewed this encounter.   Sherrell Rocky POUR, PA-C 03/04/24 1301

## 2024-03-04 NOTE — Discharge Instructions (Signed)
 Your x-ray was normal.  We gave an injection of Toradol  today so please avoid NSAIDs including aspirin, ibuprofen /Advil , naproxen /Aleve  for the next 24 hours.  You can use Tylenol /acetaminophen  as needed.  Take baclofen  up to 3 times a day.  This will make you sleepy so do not drive or drink alcohol with taking it.  Apply lidocaine  patch during the day and then remove this at night.  Use only 1 patch per 24 hours.  Use heat and gentle stretch for additional symptom relief.  If you are not feeling better within a few days please follow-up with sports medicine; call to schedule an appointment.  If anything worsens and you have worsening pain, fever, shortness of breath, chest pain, heart racing you need to go to the ER.

## 2024-03-05 NOTE — Progress Notes (Unsigned)
 [facility] [address] [place] [address] Phone: [phone] Subjective:   ISusannah Gully, am serving as a scribe for Dr. Arthea Claudene.  I'm seeing this patient by the request  of:  Jerel Gee, NP  CC: Upper back pain  YEP:Dlagzrupcz  Myleka CARINE NORDGREN is a 38 y.o. female coming in with complaint of T spine pain. Patient states pain behind L shoulder blade. Lidocaine  patches work a little. Rest and water also help. L leg sometimes tingles and L shoulder neck will have pain.   Seen as recently as July 7 in an urgent care facility.  When seen there seem to be more midline tenderness over the paraspinal musculature but mostly along the trapezius and some midline tenderness.  X-rays were ordered and these were independently visualized by me showing some very mild arthritic changes.  Has been prescribed baclofen     Past Medical History:  Diagnosis Date   Asthma    as a child   Finger fracture, left 12/12/2012   left small P2 fx.   Headache(784.0)    migraines   Hypertension    no current meds.   Mild scoliosis    Seasonal allergies    Past Surgical History:  Procedure Laterality Date   OPEN REDUCTION INTERNAL FIXATION (ORIF) METACARPAL Left 12/19/2012   Procedure: CLOSED REDUCTION PINNING LEFT SMALL P2 FRACTURE ;  Surgeon: Donnice DELENA Robinsons, MD;  Location: [facility];  Service: Orthopedics;  Laterality: Left;   TUBAL LIGATION  02/12/2008   Social History   Socioeconomic History   Marital status: Single    Spouse name: Not on file   Number of children: Not on file   Years of education: Not on file   Highest education level: Not on file  Occupational History   Not on file  Tobacco Use   Smoking status: Every Day    Current packs/day: 0.50    Average packs/day: 0.5 packs/day for 12.0 years (6.0 ttl pk-yrs)    Types: Cigarettes   Smokeless tobacco: Never  Vaping Use   Vaping status: Never Used  Substance and  Sexual Activity   Alcohol use: Yes    Comment: states socially.   Drug use: Yes    Types: Marijuana   Sexual activity: Yes    Partners: Male    Birth control/protection: Surgical    Comment: BTL  Other Topics Concern   Not on file  Social History Narrative   Not on file   Social Drivers of Health   Financial Resource Strain: Not on file  Food Insecurity: Not on file  Transportation Needs: Not on file  Physical Activity: Not on file  Stress: Not on file  Social Connections: Not on file   No Known Allergies Family History  Problem Relation Age of Onset   Hypertension Mother    Scoliosis Mother    Hypertension Father    Scoliosis Father    Hypertension Maternal Grandmother    Kidney failure Maternal Grandmother    Heart failure Maternal Grandmother    Arthritis Maternal Grandmother    Scoliosis Maternal Grandmother    Osteoporosis Maternal Grandmother    Hypertension Maternal Grandfather    Osteoporosis Maternal Grandfather    Hypertension Paternal Grandmother    Osteoporosis Paternal Grandmother    Arthritis Paternal Grandmother    Breast cancer Maternal Aunt    Lung cancer Maternal Aunt      Current Outpatient Medications (Cardiovascular):    losartan -hydrochlorothiazide (  HYZAAR) 50-12.5 MG tablet, Take 1 tablet by mouth every morning.  Current Outpatient Medications (Respiratory):    brompheniramine-pseudoephedrine-DM 30-2-10 MG/5ML syrup, Take 10 mLs by mouth every 6 (six) hours as needed.   fluticasone  (FLONASE ) 50 MCG/ACT nasal spray, Place 2 sprays into both nostrils daily.    Current Outpatient Medications (Other):    baclofen  (LIORESAL ) 10 MG tablet, Take 1 tablet (10 mg total) by mouth 3 (three) times daily as needed for muscle spasms.   lidocaine  (LIDODERM ) 5 %, Place 1 patch onto the skin daily. Remove & Discard patch within 12 hours or as directed by MD   Reviewed prior external information including notes and imaging from  primary care  provider As well as notes that were available from care everywhere and other healthcare systems.  Past medical history, social, surgical and family history all reviewed in electronic medical record.  No pertanent information unless stated regarding to the chief complaint.   Review of Systems:  No headache, visual changes, nausea, vomiting, diarrhea, constipation, dizziness, abdominal pain, skin rash, fevers, chills, night sweats, weight loss, swollen lymph nodes, body aches, joint swelling, chest pain, shortness of breath, mood changes. POSITIVE muscle aches  Objective  Blood pressure 122/86, pulse (!) 107, height 5' 11 (1.803 m), weight 230 lb (104.3 kg), last menstrual period 02/14/2024, SpO2 97%.   General: No apparent distress alert and oriented x3 mood and affect normal, dressed appropriately.  HEENT: Pupils equal, extraocular movements intact  Respiratory: Patient's speak in full sentences and does not appear short of breath  Cardiovascular: No lower extremity edema, non tender, no erythema  Significant scapular dyskinesis noted.  Fullness noted on the left side.  Worsening pain with deep inspiration.  No true though no chest pain on the anterior aspect.  No radicular symptoms up to the neck.   After verbal consent patient was prepped with alcohol swab and with a 25-gauge half inch needle injected into 4 distinct trigger points in the levator scapula, trapezius, rhomboid and latissimus dorsi muscle.  Total of 3 cc of 0.5% Marcaine  and 1 cc of Kenalog 40 mg/mL were used.  Minimal blood loss.  Postinjection instructions given Impression and Recommendations:    The above documentation has been reviewed and is accurate and complete Hedda Crumbley M Karessa Onorato, DO

## 2024-03-07 ENCOUNTER — Ambulatory Visit: Admitting: Family Medicine

## 2024-03-07 ENCOUNTER — Encounter: Payer: Self-pay | Admitting: Family Medicine

## 2024-03-07 VITALS — BP 122/86 | HR 107 | Ht 71.0 in | Wt 230.0 lb

## 2024-03-07 DIAGNOSIS — M25512 Pain in left shoulder: Secondary | ICD-10-CM | POA: Diagnosis not present

## 2024-03-07 DIAGNOSIS — M25312 Other instability, left shoulder: Secondary | ICD-10-CM | POA: Diagnosis not present

## 2024-03-07 NOTE — Patient Instructions (Addendum)
 Good to see you. Trigger Point injections in back today. Do prescribed exercises at least 3x a week Gail Remember wall exercise Lift with elbows in See me again in 2 months

## 2024-03-07 NOTE — Assessment & Plan Note (Signed)
 Dyskinesis noted, home exercises given.  Pain does seem to be out of proportion.  Multiple trigger points noted and given injection and hopeful that this will make an improvement.  If not with patient also being a smoker I do feel that advanced imaging with a CT scan could be warranted.  Patient is in agreement with the plan.  Will start conservative therapy first.

## 2024-03-07 NOTE — Assessment & Plan Note (Signed)
 Injections given, tolerated the procedure well, discussed icing regimen and home exercises, which activities to do in which ones to avoid.  Increase activity slowly.  Patient is a smoker and has had this pain for multiple months.  Patient already has tried multiple different medications and will see if this is beneficial.  Will see if this is primarily scapular dyskinesis or more secondary trigger point secondary to more underlying pathology.  Follow-up again in 6 to 8 weeks of the month

## 2024-05-06 NOTE — Progress Notes (Deleted)
 [facility] [address] [place] [address] Phone: [phone] Subjective:    I'm seeing this patient by the request  of:  Jerel Gee, NP  CC: left shoulder and scapular pain   YEP:Dlagzrupcz  03/07/2024 Injections given, tolerated the procedure well, discussed icing regimen and home exercises, which activities to do in which ones to avoid.  Increase activity slowly.  Patient is a smoker and has had this pain for multiple months.  Patient already has tried multiple different medications and will see if this is beneficial.  Will see if this is primarily scapular dyskinesis or more secondary trigger point secondary to more underlying pathology.  Follow-up again in 6 to 8 weeks of the month      Update 05/07/2024 Cassandra Nicholson is a 38 y.o. female coming in with complaint of L shoulder and scapula pain. Patient states       Past Medical History:  Diagnosis Date   Asthma    as a child   Finger fracture, left 12/12/2012   left small P2 fx.   Headache(784.0)    migraines   Hypertension    no current meds.   Mild scoliosis    Seasonal allergies    Past Surgical History:  Procedure Laterality Date   OPEN REDUCTION INTERNAL FIXATION (ORIF) METACARPAL Left 12/19/2012   Procedure: CLOSED REDUCTION PINNING LEFT SMALL P2 FRACTURE ;  Surgeon: Donnice DELENA Robinsons, MD;  Location: [facility];  Service: Orthopedics;  Laterality: Left;   TUBAL LIGATION  02/12/2008   Social History   Socioeconomic History   Marital status: Single    Spouse name: Not on file   Number of children: Not on file   Years of education: Not on file   Highest education level: Not on file  Occupational History   Not on file  Tobacco Use   Smoking status: Every Day    Current packs/day: 0.50    Average packs/day: 0.5 packs/day for 12.0 years (6.0 ttl pk-yrs)    Types: Cigarettes   Smokeless tobacco: Never  Vaping Use   Vaping status: Never Used   Substance and Sexual Activity   Alcohol use: Yes    Comment: states socially.   Drug use: Yes    Types: Marijuana   Sexual activity: Yes    Partners: Male    Birth control/protection: Surgical    Comment: BTL  Other Topics Concern   Not on file  Social History Narrative   Not on file   Social Drivers of Health   Financial Resource Strain: Not on file  Food Insecurity: Not on file  Transportation Needs: Not on file  Physical Activity: Not on file  Stress: Not on file  Social Connections: Not on file   No Known Allergies Family History  Problem Relation Age of Onset   Hypertension Mother    Scoliosis Mother    Hypertension Father    Scoliosis Father    Hypertension Maternal Grandmother    Kidney failure Maternal Grandmother    Heart failure Maternal Grandmother    Arthritis Maternal Grandmother    Scoliosis Maternal Grandmother    Osteoporosis Maternal Grandmother    Hypertension Maternal Grandfather    Osteoporosis Maternal Grandfather    Hypertension Paternal Grandmother    Osteoporosis Paternal Grandmother    Arthritis Paternal Grandmother    Breast cancer Maternal Aunt    Lung cancer Maternal Aunt      Current Outpatient Medications (Cardiovascular):  losartan -hydrochlorothiazide (HYZAAR) 50-12.5 MG tablet, Take 1 tablet by mouth every morning.  Current Outpatient Medications (Respiratory):    brompheniramine-pseudoephedrine-DM 30-2-10 MG/5ML syrup, Take 10 mLs by mouth every 6 (six) hours as needed.   fluticasone  (FLONASE ) 50 MCG/ACT nasal spray, Place 2 sprays into both nostrils daily.    Current Outpatient Medications (Other):    baclofen  (LIORESAL ) 10 MG tablet, Take 1 tablet (10 mg total) by mouth 3 (three) times daily as needed for muscle spasms.   lidocaine  (LIDODERM ) 5 %, Place 1 patch onto the skin daily. Remove & Discard patch within 12 hours or as directed by MD   Reviewed prior external information including notes and imaging from   primary care provider As well as notes that were available from care everywhere and other healthcare systems.  Past medical history, social, surgical and family history all reviewed in electronic medical record.  No pertanent information unless stated regarding to the chief complaint.   Review of Systems:  No headache, visual changes, nausea, vomiting, diarrhea, constipation, dizziness, abdominal pain, skin rash, fevers, chills, night sweats, weight loss, swollen lymph nodes, body aches, joint swelling, chest pain, shortness of breath, mood changes. POSITIVE muscle aches  Objective  There were no vitals taken for this visit.   General: No apparent distress alert and oriented x3 mood and affect normal, dressed appropriately.  HEENT: Pupils equal, extraocular movements intact  Respiratory: Patient's speak in full sentences and does not appear short of breath  Cardiovascular: No lower extremity edema, non tender, no erythema  Left shoulder shows     Impression and Recommendations:    The above documentation has been reviewed and is accurate and complete Cassandra Nicholson M Freddi Schrager, DO

## 2024-05-07 ENCOUNTER — Ambulatory Visit: Admitting: Family Medicine

## 2024-06-12 NOTE — Progress Notes (Deleted)
 [facility] [address] [place] [address] Phone: [phone] Subjective:    I'm seeing this patient by the request  of:  Jerel Gee, NP  CC:   YEP:Dlagzrupcz  03/07/2024 Injections given, tolerated the procedure well, discussed icing regimen and home exercises, which activities to do in which ones to avoid.  Increase activity slowly.  Patient is a smoker and has had this pain for multiple months.  Patient already has tried multiple different medications and will see if this is beneficial.  Will see if this is primarily scapular dyskinesis or more secondary trigger point secondary to more underlying pathology.  Follow-up again in 6 to 8 weeks of the month     Dyskinesis noted, home exercises given.  Pain does seem to be out of proportion.  Multiple trigger points noted and given injection and hopeful that this will make an improvement.  If not with patient also being a smoker I do feel that advanced imaging with a CT scan could be warranted.  Patient is in agreement with the plan.  Will start conservative therapy first.     Update 06/13/2024 Cassandra Nicholson is a 38 y.o. female coming in with complaint of L scapular pain. Patient states      Past Medical History:  Diagnosis Date   Asthma    as a child   Finger fracture, left 12/12/2012   left small P2 fx.   Headache(784.0)    migraines   Hypertension    no current meds.   Mild scoliosis    Seasonal allergies    Past Surgical History:  Procedure Laterality Date   OPEN REDUCTION INTERNAL FIXATION (ORIF) METACARPAL Left 12/19/2012   Procedure: CLOSED REDUCTION PINNING LEFT SMALL P2 FRACTURE ;  Surgeon: Donnice DELENA Robinsons, MD;  Location: [facility];  Service: Orthopedics;  Laterality: Left;   TUBAL LIGATION  02/12/2008   Social History   Socioeconomic History   Marital status: Single    Spouse name: Not on file   Number of children: Not on file   Years of education:  Not on file   Highest education level: Not on file  Occupational History   Not on file  Tobacco Use   Smoking status: Every Day    Current packs/day: 0.50    Average packs/day: 0.5 packs/day for 12.0 years (6.0 ttl pk-yrs)    Types: Cigarettes   Smokeless tobacco: Never  Vaping Use   Vaping status: Never Used  Substance and Sexual Activity   Alcohol use: Yes    Comment: states socially.   Drug use: Yes    Types: Marijuana   Sexual activity: Yes    Partners: Male    Birth control/protection: Surgical    Comment: BTL  Other Topics Concern   Not on file  Social History Narrative   Not on file   Social Drivers of Health   Financial Resource Strain: Not on file  Food Insecurity: Not on file  Transportation Needs: Not on file  Physical Activity: Not on file  Stress: Not on file  Social Connections: Not on file   No Known Allergies Family History  Problem Relation Age of Onset   Hypertension Mother    Scoliosis Mother    Hypertension Father    Scoliosis Father    Hypertension Maternal Grandmother    Kidney failure Maternal Grandmother    Heart failure Maternal Grandmother    Arthritis Maternal Grandmother    Scoliosis Maternal  Grandmother    Osteoporosis Maternal Grandmother    Hypertension Maternal Grandfather    Osteoporosis Maternal Grandfather    Hypertension Paternal Grandmother    Osteoporosis Paternal Grandmother    Arthritis Paternal Grandmother    Breast cancer Maternal Aunt    Lung cancer Maternal Aunt      Current Outpatient Medications (Cardiovascular):    losartan -hydrochlorothiazide (HYZAAR) 50-12.5 MG tablet, Take 1 tablet by mouth every morning.  Current Outpatient Medications (Respiratory):    brompheniramine-pseudoephedrine-DM 30-2-10 MG/5ML syrup, Take 10 mLs by mouth every 6 (six) hours as needed.   fluticasone  (FLONASE ) 50 MCG/ACT nasal spray, Place 2 sprays into both nostrils daily.    Current Outpatient Medications (Other):     baclofen  (LIORESAL ) 10 MG tablet, Take 1 tablet (10 mg total) by mouth 3 (three) times daily as needed for muscle spasms.   lidocaine  (LIDODERM ) 5 %, Place 1 patch onto the skin daily. Remove & Discard patch within 12 hours or as directed by MD   Reviewed prior external information including notes and imaging from  primary care provider As well as notes that were available from care everywhere and other healthcare systems.  Past medical history, social, surgical and family history all reviewed in electronic medical record.  No pertanent information unless stated regarding to the chief complaint.   Review of Systems:  No headache, visual changes, nausea, vomiting, diarrhea, constipation, dizziness, abdominal pain, skin rash, fevers, chills, night sweats, weight loss, swollen lymph nodes, body aches, joint swelling, chest pain, shortness of breath, mood changes. POSITIVE muscle aches  Objective  There were no vitals taken for this visit.   General: No apparent distress alert and oriented x3 mood and affect normal, dressed appropriately.  HEENT: Pupils equal, extraocular movements intact  Respiratory: Patient's speak in full sentences and does not appear short of breath  Cardiovascular: No lower extremity edema, non tender, no erythema      Impression and Recommendations:

## 2024-06-13 ENCOUNTER — Ambulatory Visit: Admitting: Family Medicine

## 2024-07-09 ENCOUNTER — Encounter (HOSPITAL_COMMUNITY): Payer: Self-pay | Admitting: *Deleted

## 2024-07-09 ENCOUNTER — Ambulatory Visit (HOSPITAL_COMMUNITY)
Admission: EM | Admit: 2024-07-09 | Discharge: 2024-07-09 | Disposition: A | Discharge status: Home / Self Care | Attending: Family Medicine | Admitting: Family Medicine

## 2024-07-09 ENCOUNTER — Ambulatory Visit (INDEPENDENT_AMBULATORY_CARE_PROVIDER_SITE_OTHER)

## 2024-07-09 ENCOUNTER — Other Ambulatory Visit: Payer: Self-pay

## 2024-07-09 DIAGNOSIS — R079 Chest pain, unspecified: Secondary | ICD-10-CM | POA: Diagnosis not present

## 2024-07-09 DIAGNOSIS — R10A0 Flank pain, unspecified side: Secondary | ICD-10-CM | POA: Diagnosis not present

## 2024-07-09 LAB — POCT URINE DIPSTICK
Bilirubin, UA: NEGATIVE
Blood, UA: NEGATIVE
Glucose, UA: NEGATIVE mg/dL
Ketones, POC UA: NEGATIVE mg/dL
Leukocytes, UA: NEGATIVE
Nitrite, UA: NEGATIVE
POC PROTEIN,UA: NEGATIVE
Spec Grav, UA: 1.025 (ref 1.010–1.025)
Urobilinogen, UA: 0.2 U/dL
pH, UA: 6 (ref 5.0–8.0)

## 2024-07-09 LAB — POCT URINE PREGNANCY: Preg Test, Ur: NEGATIVE

## 2024-07-09 MED ORDER — DEXAMETHASONE SOD PHOSPHATE PF 10 MG/ML IJ SOLN
10.0000 mg | Freq: Once | INTRAMUSCULAR | Status: AC
  Administered 2024-07-09: 10 mg via INTRAMUSCULAR

## 2024-07-09 MED ORDER — HYDROCODONE-ACETAMINOPHEN 5-325 MG PO TABS
1.0000 | ORAL_TABLET | Freq: Every evening | ORAL | 0 refills | Status: AC | PRN
Start: 2024-07-09 — End: ?

## 2024-07-09 MED ORDER — CYCLOBENZAPRINE HCL 10 MG PO TABS: ORAL_TABLET | ORAL | 0 refills | Status: AC

## 2024-07-09 MED ORDER — KETOROLAC TROMETHAMINE 30 MG/ML IJ SOLN
INTRAMUSCULAR | Status: AC
  Filled 2024-07-09: qty 1

## 2024-07-09 NOTE — ED Provider Notes (Signed)
 [facility]   [id number] 07/09/24 Arrival Time: 9075  ASSESSMENT & PLAN:  1. Flank pain, unspecified laterality   2. Chest pain, unspecified type    Patient history and exam consistent with non-cardiac cause of chest pain. Suspect MSK etiology.  I have personally viewed the imaging studies ordered this visit. CXR: no acute changes; no pneumothorax.    Discharge Instructions       Meds ordered this encounter  Medications   dexamethasone  (DECADRON ) injection 10 mg   cyclobenzaprine  (FLEXERIL ) 10 MG tablet    Sig: Take 1 tablet by mouth 3 times daily as needed for muscle spasm. Warning: May cause drowsiness.    Dispense:  21 tablet    Refill:  0   HYDROcodone -acetaminophen  (NORCO/VICODIN) 5-325 MG tablet    Sig: Take 1 tablet by mouth at bedtime as needed for moderate pain (pain score 4-6) or severe pain (pain score 7-10).    Dispense:  4 tablet    Refill:  0        Follow-up Information     Schedule an appointment as soon as possible for a visit  with Jerel Gee, NP.   Specialty: Nurse Practitioner Why: For follow up. Contact information: [address] [place] [address] [phone]                Results for orders placed or performed during the hospital encounter of 07/09/24  POC Urinalysis Dipstick   Collection Time: 07/09/24 10:11 AM  Result Value Ref Range   Color, UA yellow yellow   Clarity, UA clear clear   Glucose, UA negative negative mg/dL   Bilirubin, UA negative negative   Ketones, POC UA negative negative mg/dL   Spec Grav, UA 8.974 8.989 - 1.025   Blood, UA negative negative   pH, UA 6.0 5.0 - 8.0   POC PROTEIN,UA negative negative, trace   Urobilinogen, UA 0.2 0.2 or 1.0 E.U./dL   Nitrite, UA Negative Negative   Leukocytes, UA Negative Negative  POCT urine pregnancy   Collection Time: 07/09/24 10:11 AM  Result Value Ref Range   Preg Test, Ur Negative Negative    Reviewed expectations re: course of  current medical issues. Questions answered. Outlined signs and symptoms indicating need for more acute intervention. Patient verbalized understanding. After Visit Summary given.   SUBJECTIVE:  History from: patient. Cassandra Nicholson is a 38 y.o. female who presents with complaint of R-sided CP; noted few d ago after moving furniture; noted along R mid axillary chest and radiates to upper anterior R chest; worse with certain movements; denies SOB. Denies chest trauma. Cigarette smoker. No tx PTA. Denies recreational drug use.  Social History   Tobacco Use  Smoking Status Every Day   Current packs/day: 0.50   Average packs/day: 0.5 packs/day for 12.0 years (6.0 ttl pk-yrs)   Types: Cigarettes  Smokeless Tobacco Never   Social History   Substance and Sexual Activity  Alcohol Use Yes   Comment: states socially.   OBJECTIVE:  Vitals:   07/09/24 0950  BP: 125/88  Pulse: 85  Resp: 18  Temp: 98.2 F (36.8 C)  SpO2: 94%    General appearance: alert, oriented, no acute distress Eyes: PERRLA; EOMI; conjunctivae normal HENT: normocephalic; atraumatic Neck: supple with FROM Lungs: without labored respirations; speaks full sentences without difficulty; CTAB Heart: regular rate and rhythm without murmer Chest Wall: with tenderness to palpation over R chest mid-axillary line up to R anterior chest wall Abdomen: soft, non-tender  Extremities: without edema; without calf swelling or tenderness; symmetrical without gross deformities Skin: warm and dry; without rash or lesions Neuro: normal gait Psychological: alert and cooperative; normal mood and affect  Labs: Results for orders placed or performed during the hospital encounter of 07/09/24  POC Urinalysis Dipstick   Collection Time: 07/09/24 10:11 AM  Result Value Ref Range   Color, UA yellow yellow   Clarity, UA clear clear   Glucose, UA negative negative mg/dL   Bilirubin, UA negative negative   Ketones, POC UA  negative negative mg/dL   Spec Grav, UA 8.974 8.989 - 1.025   Blood, UA negative negative   pH, UA 6.0 5.0 - 8.0   POC PROTEIN,UA negative negative, trace   Urobilinogen, UA 0.2 0.2 or 1.0 E.U./dL   Nitrite, UA Negative Negative   Leukocytes, UA Negative Negative  POCT urine pregnancy   Collection Time: 07/09/24 10:11 AM  Result Value Ref Range   Preg Test, Ur Negative Negative   Labs Reviewed  POCT URINE DIPSTICK - Abnormal  POCT URINE PREGNANCY    Imaging: No results found.   No Known Allergies  Past Medical History:  Diagnosis Date   Asthma    as a child   Finger fracture, left 12/12/2012   left small P2 fx.   Headache(784.0)    migraines   Hypertension    no current meds.   Mild scoliosis    Seasonal allergies    Social History   Socioeconomic History   Marital status: Single    Spouse name: Not on file   Number of children: Not on file   Years of education: Not on file   Highest education level: Not on file  Occupational History   Not on file  Tobacco Use   Smoking status: Every Day    Current packs/day: 0.50    Average packs/day: 0.5 packs/day for 12.0 years (6.0 ttl pk-yrs)    Types: Cigarettes   Smokeless tobacco: Never  Vaping Use   Vaping status: Never Used  Substance and Sexual Activity   Alcohol use: Yes    Comment: states socially.   Drug use: Yes    Types: Marijuana   Sexual activity: Yes    Partners: Male    Birth control/protection: Surgical    Comment: BTL  Other Topics Concern   Not on file  Social History Narrative   Not on file   Social Drivers of Health   Financial Resource Strain: Not on file  Food Insecurity: Not on file  Transportation Needs: Not on file  Physical Activity: Not on file  Stress: Not on file  Social Connections: Not on file  Intimate Partner Violence: Not on file   Family History  Problem Relation Age of Onset   Hypertension Mother    Scoliosis Mother    Hypertension Father    Scoliosis Father     Hypertension Maternal Grandmother    Kidney failure Maternal Grandmother    Heart failure Maternal Grandmother    Arthritis Maternal Grandmother    Scoliosis Maternal Grandmother    Osteoporosis Maternal Grandmother    Hypertension Maternal Grandfather    Osteoporosis Maternal Grandfather    Hypertension Paternal Grandmother    Osteoporosis Paternal Grandmother    Arthritis Paternal Grandmother    Breast cancer Maternal Aunt    Lung cancer Maternal Aunt    Past Surgical History:  Procedure Laterality Date   OPEN REDUCTION INTERNAL FIXATION (ORIF) METACARPAL Left 12/19/2012   Procedure: CLOSED  REDUCTION PINNING LEFT SMALL P2 FRACTURE ;  Surgeon: Donnice DELENA Robinsons, MD;  Location: [facility];  Service: Orthopedics;  Laterality: Left;   TUBAL LIGATION  02/12/2008      Rolinda Rogue, MD 07/09/24 1136

## 2024-07-09 NOTE — Discharge Instructions (Signed)
 Meds ordered this encounter  Medications   dexamethasone  (DECADRON ) injection 10 mg   cyclobenzaprine  (FLEXERIL ) 10 MG tablet    Sig: Take 1 tablet by mouth 3 times daily as needed for muscle spasm. Warning: May cause drowsiness.    Dispense:  21 tablet    Refill:  0   HYDROcodone -acetaminophen  (NORCO/VICODIN) 5-325 MG tablet    Sig: Take 1 tablet by mouth at bedtime as needed for moderate pain (pain score 4-6) or severe pain (pain score 7-10).    Dispense:  4 tablet    Refill:  0

## 2024-07-09 NOTE — ED Triage Notes (Signed)
 PT reports RT sided upper chest wall pain after moving her Grandparents belongings. Pt reports she was unable to lye on RT side this morning due to pain.  Pt had same pain when taking care of Grandmother who was in [facility].
